# Patient Record
Sex: Female | Born: 1945 | Race: White | Hispanic: No | Smoking: Current every day smoker
Health system: Southern US, Community
[De-identification: ages and names within clinical notes are randomized; demographics above are authoritative.]

## PROBLEM LIST (undated history)

## (undated) DIAGNOSIS — I10 Essential (primary) hypertension: Secondary | ICD-10-CM

## (undated) DIAGNOSIS — E785 Hyperlipidemia, unspecified: Secondary | ICD-10-CM

## (undated) DIAGNOSIS — F329 Major depressive disorder, single episode, unspecified: Secondary | ICD-10-CM

## (undated) DIAGNOSIS — F32A Depression, unspecified: Secondary | ICD-10-CM

## (undated) DIAGNOSIS — F419 Anxiety disorder, unspecified: Secondary | ICD-10-CM

## (undated) HISTORY — DX: Major depressive disorder, single episode, unspecified: F32.9

## (undated) HISTORY — DX: Hyperlipidemia, unspecified: E78.5

## (undated) HISTORY — DX: Anxiety disorder, unspecified: F41.9

## (undated) HISTORY — PX: ABDOMINAL HYSTERECTOMY: SHX81

## (undated) HISTORY — DX: Depression, unspecified: F32.A

## (undated) HISTORY — DX: Essential (primary) hypertension: I10

## (undated) HISTORY — PX: TONSILLECTOMY: SUR1361

---

## 2005-01-02 ENCOUNTER — Ambulatory Visit: Payer: Self-pay | Admitting: Unknown Physician Specialty

## 2005-01-29 ENCOUNTER — Ambulatory Visit: Payer: Self-pay | Admitting: Unknown Physician Specialty

## 2005-01-29 LAB — HM COLONOSCOPY

## 2006-01-07 ENCOUNTER — Ambulatory Visit: Payer: Self-pay | Admitting: Family Medicine

## 2006-01-21 ENCOUNTER — Ambulatory Visit: Payer: Self-pay | Admitting: Family Medicine

## 2006-03-10 ENCOUNTER — Ambulatory Visit: Payer: Self-pay | Admitting: Urology

## 2006-04-20 DIAGNOSIS — E785 Hyperlipidemia, unspecified: Secondary | ICD-10-CM | POA: Insufficient documentation

## 2006-05-14 ENCOUNTER — Ambulatory Visit: Payer: Self-pay | Admitting: Anesthesiology

## 2006-05-28 ENCOUNTER — Ambulatory Visit: Payer: Self-pay | Admitting: Anesthesiology

## 2006-08-06 ENCOUNTER — Ambulatory Visit: Payer: Self-pay | Admitting: Anesthesiology

## 2006-08-12 ENCOUNTER — Emergency Department: Payer: Self-pay | Admitting: Emergency Medicine

## 2007-11-26 ENCOUNTER — Ambulatory Visit: Payer: Self-pay | Admitting: Unknown Physician Specialty

## 2007-11-29 ENCOUNTER — Ambulatory Visit: Payer: Self-pay | Admitting: Unknown Physician Specialty

## 2010-03-04 ENCOUNTER — Ambulatory Visit: Payer: Self-pay | Admitting: Neurological Surgery

## 2015-06-04 ENCOUNTER — Other Ambulatory Visit: Payer: Self-pay | Admitting: Family Medicine

## 2015-06-04 DIAGNOSIS — M549 Dorsalgia, unspecified: Principal | ICD-10-CM

## 2015-06-04 DIAGNOSIS — G8929 Other chronic pain: Secondary | ICD-10-CM

## 2015-09-26 ENCOUNTER — Other Ambulatory Visit: Payer: Self-pay | Admitting: Family Medicine

## 2015-09-26 DIAGNOSIS — E785 Hyperlipidemia, unspecified: Secondary | ICD-10-CM

## 2015-09-28 NOTE — Telephone Encounter (Signed)
Last OV 05/10/2014  Thanks,   -Vernona Rieger

## 2015-11-30 ENCOUNTER — Ambulatory Visit (INDEPENDENT_AMBULATORY_CARE_PROVIDER_SITE_OTHER): Payer: Medicare Other | Admitting: Family Medicine

## 2015-11-30 ENCOUNTER — Encounter: Payer: Self-pay | Admitting: Family Medicine

## 2015-11-30 VITALS — BP 142/84 | HR 76 | Temp 99.1°F | Resp 16 | Wt 144.0 lb

## 2015-11-30 DIAGNOSIS — J309 Allergic rhinitis, unspecified: Secondary | ICD-10-CM | POA: Insufficient documentation

## 2015-11-30 DIAGNOSIS — J209 Acute bronchitis, unspecified: Secondary | ICD-10-CM | POA: Insufficient documentation

## 2015-11-30 DIAGNOSIS — F419 Anxiety disorder, unspecified: Secondary | ICD-10-CM | POA: Diagnosis not present

## 2015-11-30 DIAGNOSIS — G8929 Other chronic pain: Secondary | ICD-10-CM

## 2015-11-30 DIAGNOSIS — Z720 Tobacco use: Secondary | ICD-10-CM

## 2015-11-30 DIAGNOSIS — G4700 Insomnia, unspecified: Secondary | ICD-10-CM | POA: Insufficient documentation

## 2015-11-30 DIAGNOSIS — M549 Dorsalgia, unspecified: Secondary | ICD-10-CM | POA: Diagnosis not present

## 2015-11-30 DIAGNOSIS — E785 Hyperlipidemia, unspecified: Secondary | ICD-10-CM

## 2015-11-30 DIAGNOSIS — I10 Essential (primary) hypertension: Secondary | ICD-10-CM | POA: Insufficient documentation

## 2015-11-30 DIAGNOSIS — M48061 Spinal stenosis, lumbar region without neurogenic claudication: Secondary | ICD-10-CM | POA: Insufficient documentation

## 2015-11-30 DIAGNOSIS — J302 Other seasonal allergic rhinitis: Secondary | ICD-10-CM

## 2015-11-30 DIAGNOSIS — M8580 Other specified disorders of bone density and structure, unspecified site: Secondary | ICD-10-CM | POA: Insufficient documentation

## 2015-11-30 DIAGNOSIS — Z8719 Personal history of other diseases of the digestive system: Secondary | ICD-10-CM | POA: Insufficient documentation

## 2015-11-30 DIAGNOSIS — M9979 Connective tissue and disc stenosis of intervertebral foramina of abdomen and other regions: Secondary | ICD-10-CM | POA: Insufficient documentation

## 2015-11-30 DIAGNOSIS — R002 Palpitations: Secondary | ICD-10-CM | POA: Insufficient documentation

## 2015-11-30 DIAGNOSIS — M419 Scoliosis, unspecified: Secondary | ICD-10-CM | POA: Insufficient documentation

## 2015-11-30 MED ORDER — MONTELUKAST SODIUM 10 MG PO TABS: 10.0000 mg | ORAL_TABLET | Freq: Every day | ORAL | Status: DC

## 2015-11-30 MED ORDER — GABAPENTIN 300 MG PO CAPS: 300.0000 mg | ORAL_CAPSULE | Freq: Three times a day (TID) | ORAL | Status: DC

## 2015-11-30 MED ORDER — ALPRAZOLAM 0.5 MG PO TABS: 0.5000 mg | ORAL_TABLET | Freq: Two times a day (BID) | ORAL | Status: DC | PRN

## 2015-11-30 NOTE — Progress Notes (Signed)
Patient ID: Melanie FoleySandra P Fredericks, female   DOB: [birth year], 69 y.o.   MRN: [id number]         Patient: Melanie FoleySandra P Wahlquist Female    DOB: [birth year]   69 y.o.   MRN: [id number] Visit Date: 11/30/2015  Today's Provider: Lorie PhenixNancy Rajohn Henery, MD   Chief Complaint  Patient presents with  . Hyperlipidemia  . Hypertension  . Back Pain   Subjective:    Hypertension This is a chronic problem. The current episode started more than 1 year ago. The problem is unchanged. The problem is controlled (Doesn't check her blood pressure at home; but reports it was okay at her last visit with [facility].). Associated symptoms include anxiety and malaise/fatigue. Pertinent negatives include no chest pain, headaches, neck pain, palpitations, peripheral edema or shortness of breath. There are no associated agents to hypertension. Risk factors for coronary artery disease include dyslipidemia. The current treatment provides moderate improvement. There are no compliance problems.   Back Pain This is a chronic problem. The problem has been gradually worsening (Especially in the last several months.  She would like to increase her Gabapentin to three time a day. She also took Vicodin in the past but it did not help much.  Fentanyl patches worked well but it caused bad side effects.   ) since onset. The pain is present in the lumbar spine. The pain does not radiate. The pain is moderate. The pain is the same all the time. The symptoms are aggravated by lying down and bending. Pertinent negatives include no bladder incontinence, bowel incontinence, chest pain or headaches. Treatments tried: Has taken herself off narcotics. Would lke to increase her gabapentin.     Anxiety Presents for follow-up visit. The problem has been gradually worsening. Symptoms include depressed mood, excessive worry, insomnia, irritability, nervous/anxious behavior and panic. Patient reports no chest pain, decreased concentration, dizziness, palpitations,  shortness of breath or suicidal ideas. Symptoms occur most days. The severity of symptoms is moderate. The symptoms are aggravated by family issues (Pt is having to take care of her sister who is having a lot of "Medical Problems"  She is having to do all the cleaning, shopping and she is not getting any help. Really hard on her back. ). The quality of sleep is poor.   Risk factors include family history (Mom and two aunts has anxiety. ). Compliance with medications: Does not really want a change in medication.  Thinks her mood is related to her pain.     Also having a lot of sinus drainage.   Has seen the ENT for this. Was previously using Claritin and nose spray.  Did stop the Claritin and it is worse.  Prednisone and antibiotic helped. Does have sinus X-rays pending. Has not gone yet. Frustrated with everything.     Allergies  Allergen Reactions  . Hydrochlorothiazide Diarrhea   Previous Medications   ALPRAZOLAM (XANAX) 0.5 MG TABLET    Take by mouth.   AMLODIPINE (NORVASC) 10 MG TABLET    Take by mouth.   CALCIUM CARBONATE 1250 MG CAPSULE    Take by mouth.   GABAPENTIN (NEURONTIN) 300 MG CAPSULE    TAKE 1 TO 2 CAPSULES EVERY DAY AS NEEDED   LISINOPRIL (PRINIVIL,ZESTRIL) 10 MG TABLET    Take by mouth.   MELOXICAM (MOBIC) 15 MG TABLET    Take by mouth.   METOPROLOL (LOPRESSOR) 50 MG TABLET    Take by mouth.   SIMVASTATIN (ZOCOR) 40 MG TABLET  TAKE 1 TABLET EVERY NIGHT AT BEDTIME   VITAMIN E 1000 UNIT CAPSULE    Take by mouth.    Review of Systems  Constitutional: Positive for malaise/fatigue and irritability.  HENT: Positive for congestion (Followed by [facility]. ).   Respiratory: Positive for cough, chest tightness and wheezing. Negative for apnea, choking, shortness of breath and stridor.   Cardiovascular: Negative for chest pain, palpitations and leg swelling.  Gastrointestinal: Negative.  Negative for bowel incontinence.  Genitourinary: Negative.  Negative for bladder  incontinence.  Musculoskeletal: Positive for back pain. Negative for joint swelling, arthralgias, gait problem, neck pain and neck stiffness.  Neurological: Negative for dizziness, light-headedness and headaches.  Psychiatric/Behavioral: Positive for agitation. Negative for suicidal ideas and decreased concentration. The patient is nervous/anxious and has insomnia.     Social History  Substance Use Topics  . Smoking status: Current Every Day Smoker  . Smokeless tobacco: Never Used  . Alcohol Use: No   Objective:   BP 142/84 mmHg  Pulse 76  Temp(Src) 99.1 F (37.3 C) (Oral)  Resp 16  Wt 144 lb (65.318 kg)  Physical Exam  Constitutional: She is oriented to person, place, and time. She appears well-developed and well-nourished.  Cardiovascular: Normal rate and regular rhythm.   Pulmonary/Chest: Effort normal and breath sounds normal.  Neurological: She is alert and oriented to person, place, and time.  Psychiatric: She has a normal mood and affect. Her behavior is normal. Judgment and thought content normal.      Assessment & Plan:     1. Essential hypertension Condition is stable. Please continue current medication and  plan of care as noted.  Will check labs.   - CBC with Differential/Platelet - Comprehensive Metabolic Panel (CMET)  2. HLD (hyperlipidemia) Needs labs checked.    - Lipid panel  3. Anxiety Continue current medication.  Will refill today.    - ALPRAZolam (XANAX) 0.5 MG tablet; Take 1 tablet (0.5 mg total) by mouth 2 (two) times daily as needed for anxiety.  Dispense: 60 tablet; Refill: 5 - TSH  4. Chronic back pain Unchanged.  Will increase gabapentin and see if helps. Does not want to go on narcotics.   - gabapentin (NEURONTIN) 300 MG capsule; Take 1 capsule (300 mg total) by mouth 3 (three) times daily.  Dispense: 270 capsule; Refill: 3   5. Other seasonal allergic rhinitis Still smokes and does not want to quit. Will add Singulair for congestion.      - montelukast (SINGULAIR) 10 MG tablet; Take 1 tablet (10 mg total) by mouth at bedtime.  Dispense: 90 tablet; Refill: 3  6. Tobacco abuse Still smokes. Does have "smoker's cough" and hoarseness. Not interested in quitting.       Lorie Phenix, MD  [facility]

## 2015-12-01 ENCOUNTER — Encounter: Payer: Self-pay | Admitting: Family Medicine

## 2015-12-01 DIAGNOSIS — Z720 Tobacco use: Secondary | ICD-10-CM | POA: Insufficient documentation

## 2015-12-01 LAB — COMPREHENSIVE METABOLIC PANEL
ALBUMIN: 4.1 g/dL (ref 3.5–4.8)
ALK PHOS: 90 IU/L (ref 39–117)
ALT: 12 IU/L (ref 0–32)
AST: 16 IU/L (ref 0–40)
Albumin/Globulin Ratio: 1.8 (ref 1.1–2.5)
BILIRUBIN TOTAL: 0.4 mg/dL (ref 0.0–1.2)
BUN / CREAT RATIO: 20 (ref 11–26)
BUN: 13 mg/dL (ref 8–27)
CHLORIDE: 100 mmol/L (ref 97–106)
CO2: 25 mmol/L (ref 18–29)
Calcium: 9.5 mg/dL (ref 8.7–10.3)
Creatinine, Ser: 0.65 mg/dL (ref 0.57–1.00)
GFR calc Af Amer: 104 mL/min/{1.73_m2} (ref >59)
GFR calc non Af Amer: 90 mL/min/{1.73_m2} (ref >59)
GLOBULIN, TOTAL: 2.3 g/dL (ref 1.5–4.5)
GLUCOSE: 92 mg/dL (ref 65–99)
POTASSIUM: 4.7 mmol/L (ref 3.5–5.2)
SODIUM: 141 mmol/L (ref 136–144)
Total Protein: 6.4 g/dL (ref 6.0–8.5)

## 2015-12-01 LAB — CBC WITH DIFFERENTIAL/PLATELET
BASOS: 0 %
Basophils Absolute: 0.1 10*3/uL (ref 0.0–0.2)
EOS (ABSOLUTE): 0.2 10*3/uL (ref 0.0–0.4)
Eos: 2 %
HEMOGLOBIN: 14.8 g/dL (ref 11.1–15.9)
Hematocrit: 43 % (ref 34.0–46.6)
IMMATURE GRANS (ABS): 0 10*3/uL (ref 0.0–0.1)
IMMATURE GRANULOCYTES: 0 %
LYMPHS: 19 %
Lymphocytes Absolute: 2.2 10*3/uL (ref 0.7–3.1)
MCH: 33.5 pg — ABNORMAL HIGH (ref 26.6–33.0)
MCHC: 34.4 g/dL (ref 31.5–35.7)
MCV: 97 fL (ref 79–97)
MONOCYTES: 7 %
Monocytes Absolute: 0.8 10*3/uL (ref 0.1–0.9)
NEUTROS ABS: 8.6 10*3/uL — AB (ref 1.4–7.0)
Neutrophils: 72 %
PLATELETS: 304 10*3/uL (ref 150–379)
RBC: 4.42 x10E6/uL (ref 3.77–5.28)
RDW: 13.2 % (ref 12.3–15.4)
WBC: 11.9 10*3/uL — AB (ref 3.4–10.8)

## 2015-12-01 LAB — LIPID PANEL
CHOLESTEROL TOTAL: 155 mg/dL (ref 100–199)
Chol/HDL Ratio: 2.9 ratio units (ref 0.0–4.4)
HDL: 53 mg/dL (ref >39)
LDL CALC: 68 mg/dL (ref 0–99)
Triglycerides: 171 mg/dL — ABNORMAL HIGH (ref 0–149)
VLDL CHOLESTEROL CAL: 34 mg/dL (ref 5–40)

## 2015-12-01 LAB — TSH: TSH: 1.39 u[IU]/mL (ref 0.450–4.500)

## 2015-12-04 ENCOUNTER — Telehealth: Payer: Self-pay

## 2015-12-04 NOTE — Telephone Encounter (Signed)
LMTCB 12/04/2015  Thanks,   -Kristl Morioka  

## 2015-12-04 NOTE — Telephone Encounter (Signed)
Pt advised as directed below.  She will call in early February for a lab sheet.   Thanks,   -Vernona RiegerLaura

## 2015-12-04 NOTE — Telephone Encounter (Signed)
-----   Message from Lorie PhenixNancy Maloney, MD sent at 12/01/2015 12:46 PM EST ----- Labs stable except for very mildly elevated white cell count.  Recheck in 6 weeks for stability. Thanks.

## 2015-12-11 ENCOUNTER — Other Ambulatory Visit: Payer: Self-pay | Admitting: Family Medicine

## 2015-12-11 DIAGNOSIS — I10 Essential (primary) hypertension: Secondary | ICD-10-CM

## 2016-01-11 DIAGNOSIS — J0191 Acute recurrent sinusitis, unspecified: Secondary | ICD-10-CM | POA: Diagnosis not present

## 2016-01-11 DIAGNOSIS — R490 Dysphonia: Secondary | ICD-10-CM | POA: Diagnosis not present

## 2016-01-11 DIAGNOSIS — J310 Chronic rhinitis: Secondary | ICD-10-CM | POA: Diagnosis not present

## 2016-01-17 ENCOUNTER — Other Ambulatory Visit: Payer: Self-pay

## 2016-01-17 DIAGNOSIS — E785 Hyperlipidemia, unspecified: Secondary | ICD-10-CM

## 2016-01-17 MED ORDER — SIMVASTATIN 40 MG PO TABS: 40.0000 mg | ORAL_TABLET | Freq: Every day | ORAL | Status: DC

## 2016-01-23 ENCOUNTER — Other Ambulatory Visit: Payer: Self-pay | Admitting: Family Medicine

## 2016-01-23 ENCOUNTER — Telehealth: Payer: Self-pay | Admitting: Family Medicine

## 2016-01-23 DIAGNOSIS — D72829 Elevated white blood cell count, unspecified: Secondary | ICD-10-CM | POA: Insufficient documentation

## 2016-01-23 NOTE — Telephone Encounter (Signed)
Repeat WBC. Thanks.

## 2016-01-24 LAB — CBC WITH DIFFERENTIAL/PLATELET
BASOS: 0 %
Basophils Absolute: 0 10*3/uL (ref 0.0–0.2)
EOS (ABSOLUTE): 0.2 10*3/uL (ref 0.0–0.4)
Eos: 2 %
Hematocrit: 42.6 % (ref 34.0–46.6)
Hemoglobin: 14.4 g/dL (ref 11.1–15.9)
IMMATURE GRANS (ABS): 0 10*3/uL (ref 0.0–0.1)
Immature Granulocytes: 0 %
LYMPHS ABS: 2.6 10*3/uL (ref 0.7–3.1)
LYMPHS: 21 %
MCH: 32.8 pg (ref 26.6–33.0)
MCHC: 33.8 g/dL (ref 31.5–35.7)
MCV: 97 fL (ref 79–97)
MONOS ABS: 1.1 10*3/uL — AB (ref 0.1–0.9)
Monocytes: 9 %
NEUTROS ABS: 8.2 10*3/uL — AB (ref 1.4–7.0)
Neutrophils: 68 %
PLATELETS: 274 10*3/uL (ref 150–379)
RBC: 4.39 x10E6/uL (ref 3.77–5.28)
RDW: 13.9 % (ref 12.3–15.4)
WBC: 12.2 10*3/uL — ABNORMAL HIGH (ref 3.4–10.8)

## 2016-01-25 ENCOUNTER — Telehealth: Payer: Self-pay

## 2016-01-25 NOTE — Telephone Encounter (Signed)
-----   Message from Lorie Phenix, MD sent at 01/24/2016 12:01 PM EST ----- White counts about the same, but character of cells has changed.  Doe not think is significant. Recheck in 3 months for stability.  Make sure patient with no fevers, weight loss or unusual symptoms.

## 2016-01-25 NOTE — Telephone Encounter (Signed)
Pt advised; she will call [facility] to set up an appointment.   Thanks,   -Vernona Rieger

## 2016-01-25 NOTE — Telephone Encounter (Signed)
In same office. Should not need another referral. Do not know what they do with internal transfer.  Thanks.

## 2016-01-25 NOTE — Telephone Encounter (Signed)
Pt c/o sinus sx. Has been seeing Dr. Willeen Cass, ENT. He put pt on Prednisone and Levaquin x 14 days (today was the last day taking the abx), Pt still c/o H/A, pressure between eyes and over eyebrows, rhinorrhea that is clear. Pt also taking Flonase, nasal saline irrigations, and Singulair qhs. ENT performed nasal endoscopy, and found "crust in back of sinuses". Pt is requesting if she could have a referral to Dr. Jenne Campus. Please advise. Allene Dillon, CMA

## 2016-03-18 DIAGNOSIS — R69 Illness, unspecified: Secondary | ICD-10-CM | POA: Diagnosis not present

## 2016-03-18 DIAGNOSIS — R51 Headache: Secondary | ICD-10-CM | POA: Diagnosis not present

## 2016-03-18 DIAGNOSIS — J309 Allergic rhinitis, unspecified: Secondary | ICD-10-CM | POA: Diagnosis not present

## 2016-04-15 ENCOUNTER — Other Ambulatory Visit: Payer: Self-pay

## 2016-04-15 DIAGNOSIS — D72829 Elevated white blood cell count, unspecified: Secondary | ICD-10-CM

## 2016-04-15 DIAGNOSIS — J305 Allergic rhinitis due to food: Secondary | ICD-10-CM | POA: Diagnosis not present

## 2016-04-15 DIAGNOSIS — J301 Allergic rhinitis due to pollen: Secondary | ICD-10-CM | POA: Diagnosis not present

## 2016-04-21 DIAGNOSIS — D72829 Elevated white blood cell count, unspecified: Secondary | ICD-10-CM | POA: Diagnosis not present

## 2016-04-22 ENCOUNTER — Telehealth: Payer: Self-pay

## 2016-04-22 LAB — CBC WITH DIFFERENTIAL/PLATELET
BASOS ABS: 0.1 10*3/uL (ref 0.0–0.2)
Basos: 1 %
EOS (ABSOLUTE): 0.2 10*3/uL (ref 0.0–0.4)
EOS: 2 %
HEMATOCRIT: 43.6 % (ref 34.0–46.6)
HEMOGLOBIN: 14.5 g/dL (ref 11.1–15.9)
IMMATURE GRANS (ABS): 0 10*3/uL (ref 0.0–0.1)
Immature Granulocytes: 0 %
LYMPHS ABS: 2.6 10*3/uL (ref 0.7–3.1)
LYMPHS: 31 %
MCH: 33.2 pg — ABNORMAL HIGH (ref 26.6–33.0)
MCHC: 33.3 g/dL (ref 31.5–35.7)
MCV: 100 fL — ABNORMAL HIGH (ref 79–97)
MONOCYTES: 9 %
Monocytes Absolute: 0.7 10*3/uL (ref 0.1–0.9)
Neutrophils Absolute: 4.8 10*3/uL (ref 1.4–7.0)
Neutrophils: 57 %
Platelets: 292 10*3/uL (ref 150–379)
RBC: 4.37 x10E6/uL (ref 3.77–5.28)
RDW: 13.8 % (ref 12.3–15.4)
WBC: 8.4 10*3/uL (ref 3.4–10.8)

## 2016-04-22 NOTE — Telephone Encounter (Signed)
-----   Message from Lorie PhenixNancy Maloney, MD sent at 04/22/2016 10:21 AM EDT ----- Labs stable.   Please notify patient. Thanks.

## 2016-04-22 NOTE — Telephone Encounter (Signed)
Pt advised.   Thanks,   -Laura  

## 2016-05-14 DIAGNOSIS — J323 Chronic sphenoidal sinusitis: Secondary | ICD-10-CM | POA: Diagnosis not present

## 2016-05-14 DIAGNOSIS — R0982 Postnasal drip: Secondary | ICD-10-CM | POA: Diagnosis not present

## 2016-05-20 ENCOUNTER — Other Ambulatory Visit: Payer: Self-pay | Admitting: Family Medicine

## 2016-05-20 DIAGNOSIS — I10 Essential (primary) hypertension: Secondary | ICD-10-CM

## 2016-07-14 ENCOUNTER — Telehealth: Payer: Self-pay

## 2016-07-14 DIAGNOSIS — E785 Hyperlipidemia, unspecified: Secondary | ICD-10-CM

## 2016-07-14 MED ORDER — SIMVASTATIN 40 MG PO TABS: 40.0000 mg | ORAL_TABLET | Freq: Every day | ORAL | 0 refills | Status: DC

## 2016-07-14 NOTE — Telephone Encounter (Signed)
Patient called and states that she needs Simvastatin refilled. She use to see Dr Elease Hashimoto. 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 I advised patient that its been over 6 months since she was here and that she would need to come in probably but will send a message back to check. Please review-aa

## 2016-07-14 NOTE — Telephone Encounter (Signed)
Pt advised and she will call back and make an appointment-aa

## 2016-07-14 NOTE — Telephone Encounter (Signed)
Will give 30 days but needs to schedule to have cholesterol rechecked.

## 2016-07-15 ENCOUNTER — Other Ambulatory Visit: Payer: Self-pay | Admitting: Family Medicine

## 2016-07-15 DIAGNOSIS — F419 Anxiety disorder, unspecified: Secondary | ICD-10-CM

## 2016-07-15 NOTE — Telephone Encounter (Signed)
Prescription for Alprazolam 0.5 was called to [facility].  Thanks,  -Avrianna Smart

## 2016-08-18 ENCOUNTER — Ambulatory Visit (INDEPENDENT_AMBULATORY_CARE_PROVIDER_SITE_OTHER): Payer: Medicare HMO | Admitting: Family Medicine

## 2016-08-18 ENCOUNTER — Encounter: Payer: Self-pay | Admitting: Family Medicine

## 2016-08-18 VITALS — BP 140/98 | HR 70 | Temp 98.5°F | Resp 16 | Wt 145.0 lb

## 2016-08-18 DIAGNOSIS — M48061 Spinal stenosis, lumbar region without neurogenic claudication: Secondary | ICD-10-CM

## 2016-08-18 DIAGNOSIS — G8929 Other chronic pain: Secondary | ICD-10-CM

## 2016-08-18 DIAGNOSIS — M549 Dorsalgia, unspecified: Secondary | ICD-10-CM

## 2016-08-18 DIAGNOSIS — E785 Hyperlipidemia, unspecified: Secondary | ICD-10-CM

## 2016-08-18 DIAGNOSIS — M4806 Spinal stenosis, lumbar region: Secondary | ICD-10-CM | POA: Diagnosis not present

## 2016-08-18 DIAGNOSIS — I10 Essential (primary) hypertension: Secondary | ICD-10-CM

## 2016-08-18 MED ORDER — GABAPENTIN 300 MG PO CAPS: ORAL_CAPSULE | ORAL | 1 refills | Status: DC

## 2016-08-18 MED ORDER — LISINOPRIL 10 MG PO TABS: 10.0000 mg | ORAL_TABLET | Freq: Every day | ORAL | 1 refills | Status: DC

## 2016-08-18 MED ORDER — SIMVASTATIN 40 MG PO TABS: 40.0000 mg | ORAL_TABLET | Freq: Every day | ORAL | 1 refills | Status: DC

## 2016-08-18 NOTE — Patient Instructions (Signed)
Let us know how you are doing on the increased dose of gabapentin.

## 2016-08-18 NOTE — Progress Notes (Signed)
Subjective:     Patient ID: Melanie White, female   DOB: 17-Dec-1946, 70 y.o.   MRN: [id number]  HPI  Chief Complaint  Patient presents with  . Hypertension    Follow up from 11/30/15, patients condition at last visit was stable and was advised to continue medication. Patients blood pressure at last visit was 142/84, patients reports poor compliance on medication.  . Hyperlipidemia    Follow up from 11/30/15, Lipid panel was ordered.   . Back Pain    Patient would like to address chronic back pain and discuss increasing Gabapentin  States her back tends to hurt more in the AM. Currently wears a TLO and has it on in the office today.Last labs 11/2015.   Review of Systems  Respiratory: Negative for shortness of breath.   Cardiovascular: Negative for chest pain and palpitations.       Objective:   Physical Exam  Constitutional: She appears well-developed and well-nourished. No distress.  Cardiovascular: Normal rate and regular rhythm.   Pulmonary/Chest: Breath sounds normal.  Musculoskeletal: She exhibits no edema (of lower extremities).       Assessment:    1. Lumbar canal stenosis  2. Chronic back pain - gabapentin (NEURONTIN) 300 MG capsule; Take two pills in the AM then one at midday then one in the evening  Dispense: 360 capsule; Refill: 1  3. Essential hypertension: continue metoprolol. - lisinopril (PRINIVIL,ZESTRIL) 10 MG tablet; Take 1 tablet (10 mg total) by mouth daily.  Dispense: 90 tablet; Refill: 1  4. HLD (hyperlipidemia) - simvastatin (ZOCOR) 40 MG tablet; Take 1 tablet (40 mg total) by mouth at bedtime.  Dispense: 90 tablet; Refill: 1    Plan:   Update labs at next office visit.

## 2016-11-15 ENCOUNTER — Other Ambulatory Visit: Payer: Self-pay | Admitting: Family Medicine

## 2016-11-15 DIAGNOSIS — I10 Essential (primary) hypertension: Secondary | ICD-10-CM

## 2016-11-17 ENCOUNTER — Other Ambulatory Visit: Payer: Self-pay | Admitting: Family Medicine

## 2016-11-17 DIAGNOSIS — I10 Essential (primary) hypertension: Secondary | ICD-10-CM

## 2016-11-17 MED ORDER — METOPROLOL TARTRATE 50 MG PO TABS: 50.0000 mg | ORAL_TABLET | Freq: Two times a day (BID) | ORAL | 1 refills | Status: DC

## 2017-01-16 ENCOUNTER — Other Ambulatory Visit: Payer: Self-pay | Admitting: Family Medicine

## 2017-01-16 DIAGNOSIS — F419 Anxiety disorder, unspecified: Secondary | ICD-10-CM

## 2017-01-16 NOTE — Telephone Encounter (Signed)
Please review-aa 

## 2017-01-16 NOTE — Telephone Encounter (Signed)
RX called in-aa 

## 2017-02-16 ENCOUNTER — Encounter: Payer: Self-pay | Admitting: Family Medicine

## 2017-02-16 ENCOUNTER — Ambulatory Visit (INDEPENDENT_AMBULATORY_CARE_PROVIDER_SITE_OTHER): Payer: Medicare HMO | Admitting: Family Medicine

## 2017-02-16 VITALS — BP 154/94 | HR 79 | Temp 99.4°F | Resp 16 | Wt 141.8 lb

## 2017-02-16 DIAGNOSIS — J302 Other seasonal allergic rhinitis: Secondary | ICD-10-CM

## 2017-02-16 DIAGNOSIS — M545 Low back pain: Secondary | ICD-10-CM | POA: Diagnosis not present

## 2017-02-16 DIAGNOSIS — I10 Essential (primary) hypertension: Secondary | ICD-10-CM | POA: Diagnosis not present

## 2017-02-16 DIAGNOSIS — R1013 Epigastric pain: Secondary | ICD-10-CM | POA: Diagnosis not present

## 2017-02-16 DIAGNOSIS — G8929 Other chronic pain: Secondary | ICD-10-CM | POA: Diagnosis not present

## 2017-02-16 DIAGNOSIS — E782 Mixed hyperlipidemia: Secondary | ICD-10-CM | POA: Diagnosis not present

## 2017-02-16 DIAGNOSIS — M48062 Spinal stenosis, lumbar region with neurogenic claudication: Secondary | ICD-10-CM | POA: Diagnosis not present

## 2017-02-16 MED ORDER — PANTOPRAZOLE SODIUM 40 MG PO TBEC: 40.0000 mg | DELAYED_RELEASE_TABLET | Freq: Every day | ORAL | 0 refills | Status: DC

## 2017-02-16 MED ORDER — SIMVASTATIN 40 MG PO TABS: 40.0000 mg | ORAL_TABLET | Freq: Every day | ORAL | 1 refills | Status: DC

## 2017-02-16 MED ORDER — GABAPENTIN 300 MG PO CAPS: ORAL_CAPSULE | ORAL | 1 refills | Status: DC

## 2017-02-16 MED ORDER — LISINOPRIL 20 MG PO TABS: 20.0000 mg | ORAL_TABLET | Freq: Every day | ORAL | 3 refills | Status: DC

## 2017-02-16 MED ORDER — MONTELUKAST SODIUM 10 MG PO TABS: 10.0000 mg | ORAL_TABLET | Freq: Every day | ORAL | 3 refills | Status: DC

## 2017-02-16 NOTE — Progress Notes (Signed)
Subjective:     Patient ID: Melanie White, female   DOB: Mar 13, 1946, 71 y.o.   MRN: [id number]  HPI  Chief Complaint  Patient presents with  . Hypertension    Patient comes in office today for 6 month follow up, last office visit was 08/18/16. At last visit patient was encouraged to continue Metoprolol and Lisinopril, patient blood pressure at visit was 140/98. Patient reports good compliance and tolerance on medication.  . Hyperlipidemia    Patient returns for follow up, last office visit 08/18/16. At last visit patient encouraged to continue Simvastatin 40mg , patient reports good complaince and tolerance on medication. Patient states that she is working on improving her eating habits but is not actively exercising.   . Back Pain    Patient returns for follow up last office visit was 08/18/16, Gabapendtin was increased to 30mg . Patient would like to discuss today changing medication, stating that it is causing her G.I upset.   States she has progression of her lumbar spinal stenosis with pain radiation down her thigh. Has been taking  6 ibuprofen daily, gabapentin, and using a back brace. Reports ESI in the past. Has developed stomach burning and occasional nausea whenever she eats. Reports hx of duodenal ulcer. Not currently on acid blocker prophylaxis.   Review of Systems  Gastrointestinal: Negative for blood in stool.       Objective:   Physical Exam  Constitutional: She appears well-developed and well-nourished. No distress (wearing a TLO).  Cardiovascular: Normal rate and regular rhythm.   Pulmonary/Chest: Breath sounds normal.  Abdominal: Soft. There is tenderness (mild epigastric). There is no guarding.  Musculoskeletal: She exhibits no edema (of lower extremties).       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 Mixed hyperlipidemia - simvastatin (ZOCOR) 40 MG tablet; Take 1 tablet (40 mg total) by mouth at bedtime.  Dispense: 90  tablet; Refill: 1 - Lipid panel  3. Essential hypertension: increase lisinopril to 20 mg. - Comprehensive metabolic panel  4. Chronic right-sided low back pain, with sciatica presence unspecified - gabapentin (NEURONTIN) 300 MG capsule; Take two pills in the AM then one at midday then one in the evening  Dispense: 360 capsule; Refill: 1  5. Other seasonal allergic rhinitis - montelukast (SINGULAIR) 10 MG tablet; Take 1 tablet (10 mg total) by mouth at bedtime.  Dispense: 90 tablet; Refill: 3  6. Epigastric abdominal pain - CBC with Differential/Platelet - Lipase - pantoprazole (PROTONIX) 40 MG tablet; Take 1 tablet (40 mg total) by mouth daily.  Dispense: 30 tablet; Refill: 0 - H. pylori breath test    Plan:    Further f/u in 4 weeks and pending lab work. She will consider referral back to neurosurgery.

## 2017-02-16 NOTE — Patient Instructions (Signed)
Hold ibuprofen and use Tylenol up to 3000 mg/day.

## 2017-02-17 LAB — CBC WITH DIFFERENTIAL/PLATELET
BASOS: 1 %
Basophils Absolute: 0.1 10*3/uL (ref 0.0–0.2)
EOS (ABSOLUTE): 0.2 10*3/uL (ref 0.0–0.4)
Eos: 3 %
HEMOGLOBIN: 14.7 g/dL (ref 11.1–15.9)
Hematocrit: 42.3 % (ref 34.0–46.6)
IMMATURE GRANS (ABS): 0 10*3/uL (ref 0.0–0.1)
IMMATURE GRANULOCYTES: 0 %
Lymphocytes Absolute: 2.8 10*3/uL (ref 0.7–3.1)
Lymphs: 34 %
MCH: 33 pg (ref 26.6–33.0)
MCHC: 34.8 g/dL (ref 31.5–35.7)
MCV: 95 fL (ref 79–97)
MONOCYTES: 8 %
Monocytes Absolute: 0.7 10*3/uL (ref 0.1–0.9)
NEUTROS ABS: 4.7 10*3/uL (ref 1.4–7.0)
NEUTROS PCT: 54 %
Platelets: 282 10*3/uL (ref 150–379)
RBC: 4.46 x10E6/uL (ref 3.77–5.28)
RDW: 13.7 % (ref 12.3–15.4)
WBC: 8.5 10*3/uL (ref 3.4–10.8)

## 2017-02-17 LAB — COMPREHENSIVE METABOLIC PANEL
ALT: 14 IU/L (ref 0–32)
AST: 16 IU/L (ref 0–40)
Albumin/Globulin Ratio: 1.7 (ref 1.2–2.2)
Albumin: 4.2 g/dL (ref 3.5–4.8)
Alkaline Phosphatase: 105 IU/L (ref 39–117)
BUN/Creatinine Ratio: 20 (ref 12–28)
BUN: 12 mg/dL (ref 8–27)
Bilirubin Total: 0.2 mg/dL (ref 0.0–1.2)
CALCIUM: 9.6 mg/dL (ref 8.7–10.3)
CO2: 27 mmol/L (ref 18–29)
CREATININE: 0.59 mg/dL (ref 0.57–1.00)
Chloride: 98 mmol/L (ref 96–106)
GFR, EST AFRICAN AMERICAN: 106 mL/min/{1.73_m2} (ref >59)
GFR, EST NON AFRICAN AMERICAN: 92 mL/min/{1.73_m2} (ref >59)
GLUCOSE: 90 mg/dL (ref 65–99)
Globulin, Total: 2.5 g/dL (ref 1.5–4.5)
Potassium: 4.9 mmol/L (ref 3.5–5.2)
Sodium: 140 mmol/L (ref 134–144)
TOTAL PROTEIN: 6.7 g/dL (ref 6.0–8.5)

## 2017-02-17 LAB — LIPASE: LIPASE: 53 U/L (ref 14–85)

## 2017-02-17 LAB — LIPID PANEL
Chol/HDL Ratio: 4 ratio units (ref 0.0–4.4)
Cholesterol, Total: 175 mg/dL (ref 100–199)
HDL: 44 mg/dL (ref >39)
LDL CALC: 85 mg/dL (ref 0–99)
Triglycerides: 231 mg/dL — ABNORMAL HIGH (ref 0–149)
VLDL CHOLESTEROL CAL: 46 mg/dL — AB (ref 5–40)

## 2017-02-18 ENCOUNTER — Telehealth: Payer: Self-pay

## 2017-02-18 LAB — H. PYLORI BREATH TEST

## 2017-02-18 LAB — H.PYLORI BREATH TEST (REFLEX): H. PYLORI BREATH TEST: NEGATIVE

## 2017-02-18 NOTE — Telephone Encounter (Signed)
-----   Message from Anola Gurneyobert Chauvin, [place] sent at 02/18/2017  7:53 AM EST ----- Your lab tests look good without anemia or sign of ulcer producing bacteria. Continue with Protonix and stay of ibuprofen or similar until we see you in 4 weeks.

## 2017-02-18 NOTE — Telephone Encounter (Signed)
[id number]-KW 

## 2017-02-18 NOTE — Telephone Encounter (Signed)
Patient has been advised. KW 

## 2017-03-16 ENCOUNTER — Ambulatory Visit: Payer: Medicare HMO | Admitting: Family Medicine

## 2017-03-30 ENCOUNTER — Encounter: Payer: Self-pay | Admitting: Family Medicine

## 2017-03-30 ENCOUNTER — Ambulatory Visit (INDEPENDENT_AMBULATORY_CARE_PROVIDER_SITE_OTHER): Payer: Medicare HMO | Admitting: Family Medicine

## 2017-03-30 VITALS — BP 144/92 | HR 71 | Temp 98.5°F | Resp 16 | Wt 140.0 lb

## 2017-03-30 DIAGNOSIS — I10 Essential (primary) hypertension: Secondary | ICD-10-CM | POA: Diagnosis not present

## 2017-03-30 DIAGNOSIS — Z8719 Personal history of other diseases of the digestive system: Secondary | ICD-10-CM

## 2017-03-30 DIAGNOSIS — M48062 Spinal stenosis, lumbar region with neurogenic claudication: Secondary | ICD-10-CM | POA: Diagnosis not present

## 2017-03-30 MED ORDER — RANITIDINE HCL 150 MG PO TABS: 150.0000 mg | ORAL_TABLET | Freq: Two times a day (BID) | ORAL | 3 refills | Status: DC

## 2017-03-30 MED ORDER — MELOXICAM 15 MG PO TABS: 15.0000 mg | ORAL_TABLET | Freq: Every day | ORAL | 3 refills | Status: DC

## 2017-03-30 MED ORDER — GABAPENTIN 300 MG PO CAPS: ORAL_CAPSULE | ORAL | 3 refills | Status: DC

## 2017-03-30 MED ORDER — LISINOPRIL 40 MG PO TABS: 40.0000 mg | ORAL_TABLET | Freq: Every day | ORAL | 3 refills | Status: DC

## 2017-03-30 NOTE — Patient Instructions (Signed)
We will see you in 4 weeks to check your blood pressure and see how your back medications are doing.

## 2017-03-30 NOTE — Progress Notes (Signed)
Subjective:     Patient ID: YSABELA KEISLER, female   DOB: 1946/09/14, 71 y.o.   MRN: [id number]  HPI  Chief Complaint  Patient presents with  . Hypertension    Patient returns to office today for 6 week follow up, last office visit was 02/16/17 we had increassed Lisinopril to . Patients blood pressure at last visit was 154/94, she reports good compliance and tolerance on medication.   . Back Pain    Patient returns to office for follow up of spinal setenosis of lumbar region. Patient reports that Ibuprofen and Gabapentin arent helping her with pain anymore.   States at times she will take an extra gabapentin around bedtime. States she has been told she is not a surgical candidate for her back pain. 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 Reports stomach pain from last visit resolved-she feels it was gastritis. No desire to quit smoking. Wearing a TLO today for her back. Recent labs were normal with excellent kidney function.   Review of Systems  Respiratory: Negative for shortness of breath.   Cardiovascular: Negative for chest pain and palpitations.       Objective:   Physical Exam  Constitutional: She appears well-developed and well-nourished. No distress.  Cardiovascular: Normal rate and regular rhythm.   Pulmonary/Chest: Breath sounds normal.  Musculoskeletal: She exhibits no edema (of lower extremities).       Assessment:    1. Essential hypertension - lisinopril (PRINIVIL,ZESTRIL) 40 MG tablet; Take 1 tablet (40 mg total) by mouth daily.  Dispense: 90 tablet; Refill: 3  2. Spinal stenosis of lumbar region with neurogenic claudication - gabapentin (NEURONTIN) 300 MG capsule; Take two pills three times a day  Dispense: 540 capsule; Refill: 3 - meloxicam (MOBIC) 15 MG tablet; Take 1 tablet (15 mg total) by mouth daily.  Dispense: 90 tablet; Refill: 3  3. Hx of gastritis - ranitidine (ZANTAC) 150 MG tablet; Take 1 tablet (150 mg total) by  mouth 2 (two) times daily.  Dispense: 180 tablet; Refill: 3    Plan:    Further f/u in 4 weeks.

## 2017-04-10 ENCOUNTER — Other Ambulatory Visit: Payer: Self-pay | Admitting: Family Medicine

## 2017-04-10 DIAGNOSIS — F419 Anxiety disorder, unspecified: Secondary | ICD-10-CM

## 2017-04-10 MED ORDER — ALPRAZOLAM 0.5 MG PO TABS: 0.5000 mg | ORAL_TABLET | Freq: Two times a day (BID) | ORAL | 5 refills | Status: DC | PRN

## 2017-04-10 NOTE — Progress Notes (Signed)
rx called in-aa 

## 2017-05-05 ENCOUNTER — Ambulatory Visit: Payer: Medicare HMO | Admitting: Family Medicine

## 2017-05-19 ENCOUNTER — Encounter: Payer: Self-pay | Admitting: Family Medicine

## 2017-05-19 ENCOUNTER — Ambulatory Visit (INDEPENDENT_AMBULATORY_CARE_PROVIDER_SITE_OTHER): Payer: Medicare HMO | Admitting: Family Medicine

## 2017-05-19 VITALS — BP 162/96 | HR 64 | Temp 97.7°F | Resp 16 | Wt 135.0 lb

## 2017-05-19 DIAGNOSIS — M48062 Spinal stenosis, lumbar region with neurogenic claudication: Secondary | ICD-10-CM

## 2017-05-19 DIAGNOSIS — I10 Essential (primary) hypertension: Secondary | ICD-10-CM

## 2017-05-19 DIAGNOSIS — Z8719 Personal history of other diseases of the digestive system: Secondary | ICD-10-CM | POA: Diagnosis not present

## 2017-05-19 MED ORDER — PANTOPRAZOLE SODIUM 40 MG PO TBEC: DELAYED_RELEASE_TABLET | ORAL | 0 refills | Status: DC

## 2017-05-19 MED ORDER — METOPROLOL TARTRATE 50 MG PO TABS: 50.0000 mg | ORAL_TABLET | Freq: Two times a day (BID) | ORAL | 1 refills | Status: DC

## 2017-05-19 MED ORDER — DULOXETINE HCL 30 MG PO CPEP: 30.0000 mg | ORAL_CAPSULE | Freq: Every day | ORAL | 0 refills | Status: DC

## 2017-05-19 NOTE — Patient Instructions (Signed)
Please come in for a bp check in two weeks and let us know how you are doing on the duloxetine (Cymbalta).

## 2017-05-19 NOTE — Progress Notes (Signed)
Subjective:     Patient ID: Melanie FoleySandra P Evangelist, female   DOB: [birth year], 71 y.o.   MRN: [id number]  HPI  Chief Complaint  Patient presents with  . Hypertension  States she is in a lot of pain today due to her spine/change in weather. Reports compliance with medication. Does state gabapentin was too expensive for 3 months and wishes to try a different medication. I suggested she also ask about non-insurance pricing and see it it is less. Also wishes refills on medications.   Review of Systems     Objective:   Physical Exam  Constitutional: She appears well-developed and well-nourished. No distress.  Cardiovascular: Normal rate and regular rhythm.   Pulmonary/Chest: Breath sounds normal. She has no wheezes.  Musculoskeletal: She exhibits no edema (of lower extremities).       Assessment:    1. Essential hypertension: ? Out of control due to pain and off gabapentin - metoprolol tartrate (LOPRESSOR) 50 MG tablet; Take 1 tablet (50 mg total) by mouth 2 (two) times daily.  Dispense: 180 tablet; Refill: 1  2. Spinal stenosis of lumbar region with neurogenic claudication - DULoxetine (CYMBALTA) 30 MG capsule; Take 1 capsule (30 mg total) by mouth daily.  Dispense: 30 capsule; Refill: 0  3. Hx of gastritis: continue Zantac - pantoprazole (PROTONIX) 40 MG tablet; One daily as needed for heartburn not relieved by Zantac  Dispense: 90 tablet; Refill: 0      Plan:    Nurse bp check in two weeks and check response to duloxetine.

## 2017-06-01 ENCOUNTER — Other Ambulatory Visit: Payer: Self-pay | Admitting: Family Medicine

## 2017-06-03 ENCOUNTER — Telehealth: Payer: Self-pay

## 2017-06-03 DIAGNOSIS — I10 Essential (primary) hypertension: Secondary | ICD-10-CM

## 2017-06-03 MED ORDER — AMLODIPINE BESYLATE 10 MG PO TABS: 10.0000 mg | ORAL_TABLET | Freq: Every day | ORAL | 0 refills | Status: DC

## 2017-06-03 NOTE — Telephone Encounter (Signed)
Pt comes in today for a Blood pressure check per Bob's request.  After reviewing Ms. Polka last office visit (05/19/2017)  her blood pressure was elevated at 162/96.  Pt thought her BP was elevated secondary to pain.  Nadine CountsBob then started her on Cymbalta 30mg  daily, and had her come in today for a Blood pressure check.    Ms. Rudy JewSutphen reports after taking one dose of Cymbalta she started to experience diarrhea, cold chills, nausea and no appetite.  She has not tried Cymbalta again.  She does state after a few days her symptoms completely resolved.    Today she comes in today for a Blood pressure check.  It is elevated at 176/100.  She denies any symptoms such as chest pain, heart palpitations, shortness of breath, headache, dizziness.  She is still having back pain however.    After discussing with Antony ContrasJenni she would like Ms. Schellinger to start on Amlodipine 10mg  a day and recheck with Nadine CountsBob in one week.  Also the symptoms described by the pt does not sound like common for Cymbalta and it could have been a virus.  Pt agreed to give Cymbalta another try and also agreed to start Amlodipine 10mg .  She scheduled a follow up visit with Nadine CountsBob for 06/09/2017.  Please send Amlodipine to [facility] [address]   Thanks,   -Vernona RiegerLaura

## 2017-06-03 NOTE — Telephone Encounter (Signed)
Amlodipine sent in to [facility] [address].  Nadine CountsBob just [place] you.

## 2017-06-04 ENCOUNTER — Other Ambulatory Visit: Payer: Self-pay | Admitting: Family Medicine

## 2017-06-09 ENCOUNTER — Ambulatory Visit (INDEPENDENT_AMBULATORY_CARE_PROVIDER_SITE_OTHER): Payer: Medicare HMO | Admitting: Family Medicine

## 2017-06-09 ENCOUNTER — Encounter: Payer: Self-pay | Admitting: Family Medicine

## 2017-06-09 VITALS — BP 168/90 | HR 70 | Temp 98.5°F | Resp 16 | Wt 134.8 lb

## 2017-06-09 DIAGNOSIS — I10 Essential (primary) hypertension: Secondary | ICD-10-CM

## 2017-06-09 NOTE — Patient Instructions (Signed)
Add furosemide 20 mg. In the AM and come in for a nurse bp check in 2 weeks. Stay off duloxetine.

## 2017-06-09 NOTE — Progress Notes (Signed)
Subjective:     Patient ID: Melanie FoleySandra P Sortor, female   DOB: [birth year], 71 y.o.   MRN: [id number]  HPI  Chief Complaint  Patient presents with  . Hypertension    Patient returns to office today for follow up visit, last office visit was 06/03/17. At patients last office visit blood pressure was  176/100, patient was continued on current bp meds. and advised to restart Cymbalta. Patiewnt reports that she has had good compliance and tolerance on Metoprolol, Amlodipine and Lisinopril but states that she discontinued Cymbalta again due to G.I upset.    Discussed adding a diuretic in the AM (states she has a supply of Lasix 20 mg.at home) and consider trying duloxetine at 20 mg.dose. She is going to check cost with pharmacist. May also reconsider paying for gabapentin and get back on that instead.    Review of Systems     Objective:   Physical Exam  Constitutional: She appears well-developed and well-nourished. No distress.  Cardiovascular: Normal rate and regular rhythm.   Pulmonary/Chest: No respiratory distress (coarse basilar breath sounds o/w clear.).  Musculoskeletal: She exhibits no edema (of lower extremities).       Assessment:    1. Essential hypertension: add furosemide as fourth bp medication    Plan:    Nurse bp check in two weeks.

## 2017-06-26 ENCOUNTER — Telehealth: Payer: Self-pay | Admitting: Emergency Medicine

## 2017-06-26 NOTE — Telephone Encounter (Signed)
Has she been taking furosemide (home supply) daily since I last saw her ?

## 2017-06-26 NOTE — Telephone Encounter (Signed)
LMTCB

## 2017-06-26 NOTE — Telephone Encounter (Signed)
Pt states that she has been taking furosemide once daily.

## 2017-06-26 NOTE — Telephone Encounter (Signed)
lmtcb-aa 

## 2017-06-26 NOTE — Telephone Encounter (Signed)
Have her make an office visit next week and we can discuss further medication changes and recheck her blood pressure. Continue furosemide daily.

## 2017-06-26 NOTE — Telephone Encounter (Signed)
Pt came by to have her BP checked. It was 166/82 today. Please advise if you want to adjust medications. Thanks.

## 2017-07-01 NOTE — Telephone Encounter (Signed)
appt has been scheduled for tomorrow 07/02/17. KW

## 2017-07-02 ENCOUNTER — Ambulatory Visit (INDEPENDENT_AMBULATORY_CARE_PROVIDER_SITE_OTHER): Payer: Medicare HMO | Admitting: Family Medicine

## 2017-07-02 ENCOUNTER — Encounter: Payer: Self-pay | Admitting: Family Medicine

## 2017-07-02 VITALS — BP 132/84 | HR 65 | Temp 98.2°F | Resp 16 | Wt 138.0 lb

## 2017-07-02 DIAGNOSIS — I10 Essential (primary) hypertension: Secondary | ICD-10-CM | POA: Diagnosis not present

## 2017-07-02 MED ORDER — AMLODIPINE BESYLATE 10 MG PO TABS: 10.0000 mg | ORAL_TABLET | Freq: Every day | ORAL | 3 refills | Status: DC

## 2017-07-02 NOTE — Progress Notes (Signed)
Subjective:     Patient ID: Melanie White, female   DOB: 25-Jul-1946, 71 y.o.   MRN: [id number]  HPI  Chief Complaint  Patient presents with  . Hypertension    Patient comes in office today for followup visit, at last office visit 6/19 Furosemide was added on to her list of medications. Patients blood pressure in house was 168/90, patient states she has not been checking blood pressure outside of office, she reports good compliance and tolerance on medication.   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 States her sister refuses most medical intervention but has been able to get her out to the neurologist and ophthalmologist. She occasionally sees one of the commercial house call doctors.Melanie White is aware that with her back problems she will be unable/unwilling to care for her if she is further debilitated. Discussed considering placement options. She also is going to try a new back brace and wishes me to approve it.   Review of Systems  Constitutional:       Defers mammograms       Objective:   Physical Exam  Constitutional: She appears well-developed and well-nourished. No distress.  Cardiovascular: Normal rate and regular rhythm.   Pulmonary/Chest: Breath sounds normal.  Musculoskeletal: She exhibits no edema (of lower extremities).       Assessment:    1. Essential hypertension: continues four bp medications. Consider adding spironolactone if needed with reduction in lisinopril. - amLODipine (NORVASC) 10 MG tablet; Take 1 tablet (10 mg total) by mouth daily.  Dispense: 90 tablet; Refill: 3    Plan:    Will let her try new brace after I review the application.

## 2017-08-17 ENCOUNTER — Other Ambulatory Visit: Payer: Self-pay | Admitting: Family Medicine

## 2017-08-17 DIAGNOSIS — E782 Mixed hyperlipidemia: Secondary | ICD-10-CM

## 2017-10-02 ENCOUNTER — Ambulatory Visit: Payer: Medicare HMO | Admitting: Family Medicine

## 2017-10-21 ENCOUNTER — Ambulatory Visit: Payer: Medicare HMO | Admitting: Family Medicine

## 2017-11-15 ENCOUNTER — Other Ambulatory Visit: Payer: Self-pay | Admitting: Family Medicine

## 2017-11-15 DIAGNOSIS — I10 Essential (primary) hypertension: Secondary | ICD-10-CM

## 2017-12-06 ENCOUNTER — Other Ambulatory Visit: Payer: Self-pay | Admitting: Family Medicine

## 2017-12-06 DIAGNOSIS — F419 Anxiety disorder, unspecified: Secondary | ICD-10-CM

## 2017-12-07 ENCOUNTER — Other Ambulatory Visit: Payer: Self-pay | Admitting: Family Medicine

## 2017-12-07 DIAGNOSIS — F419 Anxiety disorder, unspecified: Secondary | ICD-10-CM

## 2017-12-07 MED ORDER — ALPRAZOLAM 0.5 MG PO TABS: 0.5000 mg | ORAL_TABLET | Freq: Two times a day (BID) | ORAL | 5 refills | Status: DC | PRN

## 2018-01-25 ENCOUNTER — Telehealth: Payer: Self-pay

## 2018-01-25 ENCOUNTER — Other Ambulatory Visit: Payer: Self-pay | Admitting: Family Medicine

## 2018-01-25 NOTE — Telephone Encounter (Signed)
Patient states she is unable to get Gabapentin RX because it needs a PA. She states she is completely out of medication and needs this done ASAP. Pharmacy- [facility]. CB#[phone]

## 2018-01-25 NOTE — Telephone Encounter (Signed)
Contacted patient and told her to contact her pharmacy to have pre-authorization form faxed over to our office to start process.KW

## 2018-01-27 ENCOUNTER — Other Ambulatory Visit: Payer: Self-pay | Admitting: Family Medicine

## 2018-01-27 DIAGNOSIS — M48062 Spinal stenosis, lumbar region with neurogenic claudication: Secondary | ICD-10-CM

## 2018-01-27 MED ORDER — GABAPENTIN 300 MG PO CAPS: ORAL_CAPSULE | ORAL | 11 refills | Status: DC

## 2018-02-13 ENCOUNTER — Other Ambulatory Visit: Payer: Self-pay | Admitting: Family Medicine

## 2018-02-13 DIAGNOSIS — E782 Mixed hyperlipidemia: Secondary | ICD-10-CM

## 2018-05-14 ENCOUNTER — Encounter: Payer: Self-pay | Admitting: Family Medicine

## 2018-05-14 ENCOUNTER — Ambulatory Visit (INDEPENDENT_AMBULATORY_CARE_PROVIDER_SITE_OTHER): Payer: Medicare HMO | Admitting: Family Medicine

## 2018-05-14 VITALS — BP 124/80 | HR 71 | Temp 98.4°F | Resp 16 | Ht 66.0 in | Wt 133.0 lb

## 2018-05-14 DIAGNOSIS — J302 Other seasonal allergic rhinitis: Secondary | ICD-10-CM

## 2018-05-14 DIAGNOSIS — H6121 Impacted cerumen, right ear: Secondary | ICD-10-CM

## 2018-05-14 DIAGNOSIS — Z8719 Personal history of other diseases of the digestive system: Secondary | ICD-10-CM

## 2018-05-14 DIAGNOSIS — J301 Allergic rhinitis due to pollen: Secondary | ICD-10-CM | POA: Diagnosis not present

## 2018-05-14 DIAGNOSIS — E782 Mixed hyperlipidemia: Secondary | ICD-10-CM | POA: Diagnosis not present

## 2018-05-14 DIAGNOSIS — F419 Anxiety disorder, unspecified: Secondary | ICD-10-CM

## 2018-05-14 DIAGNOSIS — M48062 Spinal stenosis, lumbar region with neurogenic claudication: Secondary | ICD-10-CM

## 2018-05-14 DIAGNOSIS — I10 Essential (primary) hypertension: Secondary | ICD-10-CM

## 2018-05-14 DIAGNOSIS — R69 Illness, unspecified: Secondary | ICD-10-CM | POA: Diagnosis not present

## 2018-05-14 MED ORDER — MELOXICAM 15 MG PO TABS: 15.0000 mg | ORAL_TABLET | Freq: Every day | ORAL | 3 refills | Status: DC

## 2018-05-14 MED ORDER — AMLODIPINE BESYLATE 10 MG PO TABS: 10.0000 mg | ORAL_TABLET | Freq: Every day | ORAL | 3 refills | Status: DC

## 2018-05-14 MED ORDER — SIMVASTATIN 40 MG PO TABS: 40.0000 mg | ORAL_TABLET | Freq: Every day | ORAL | 3 refills | Status: DC

## 2018-05-14 MED ORDER — FUROSEMIDE 20 MG PO TABS: 20.0000 mg | ORAL_TABLET | Freq: Every day | ORAL | 3 refills | Status: DC

## 2018-05-14 MED ORDER — PANTOPRAZOLE SODIUM 40 MG PO TBEC: DELAYED_RELEASE_TABLET | ORAL | 0 refills | Status: DC

## 2018-05-14 MED ORDER — MONTELUKAST SODIUM 10 MG PO TABS: 10.0000 mg | ORAL_TABLET | Freq: Every day | ORAL | 3 refills | Status: DC

## 2018-05-14 MED ORDER — LISINOPRIL 40 MG PO TABS: 40.0000 mg | ORAL_TABLET | Freq: Every day | ORAL | 3 refills | Status: DC

## 2018-05-14 MED ORDER — RANITIDINE HCL 150 MG PO TABS: 150.0000 mg | ORAL_TABLET | Freq: Two times a day (BID) | ORAL | 3 refills | Status: DC

## 2018-05-14 MED ORDER — METOPROLOL TARTRATE 50 MG PO TABS: 50.0000 mg | ORAL_TABLET | Freq: Two times a day (BID) | ORAL | 3 refills | Status: DC

## 2018-05-14 NOTE — Progress Notes (Signed)
  Subjective:     Patient ID: Melanie White, female   DOB: 1946-03-11, 72 y.o.   MRN: [id number] Chief Complaint  Patient presents with  . Hypertension   HPI States she is exhausted taking care of er sister. States she fell again and is currently in [facility] but is not cooperating with therapy. I suggested to Melanie White this may be her opportunity to get her placed. She defers health maintenance; mammogram, bone density, pneumovax. Continues to smoke. Wishes her ears check for wax buildup.  Review of Systems  Respiratory: Negative for shortness of breath.   Cardiovascular: Negative for chest pain and palpitations.  Musculoskeletal:       Continues to wear a TLO when she is up.       Objective:   Physical Exam  Constitutional: She appears well-developed and well-nourished. No distress.  HENT:  L ear canal patent with intact TM without inflammation R ear canal obscured by cerumen. Procedure note: After irrigation by Melanie White ear canal is patent and TM intact  Cardiovascular: Normal rate and regular rhythm.  Pulmonary/Chest: Breath sounds normal.  Musculoskeletal: She exhibits no edema (of lower extremities).       Assessment:    1. Essential hypertension: continue current medications - Comprehensive metabolic panel - metoprolol tartrate (LOPRESSOR) 50 MG tablet; Take 1 tablet (50 mg total) by mouth 2 (two) times daily.  Dispense: 180 tablet; Refill: 3 - lisinopril (PRINIVIL,ZESTRIL) 40 MG tablet; Take 1 tablet (40 mg total) by mouth daily.  Dispense: 90 tablet; Refill: 3 - furosemide (LASIX) 20 MG tablet; Take 1 tablet (20 mg total) by mouth daily.  Dispense: 90 tablet; Refill: 3 - amLODipine (NORVASC) 10 MG tablet; Take 1 tablet (10 mg total) by mouth daily.  Dispense: 90 tablet; Refill: 3  2. Anxiety: continues with Xanax daily.  3. Cerumen debris on tympanic membrane of right ear: ear irrigation  4. Mixed hyperlipidemia - Lipid panel - simvastatin (ZOCOR) 40  MG tablet; Take 1 tablet (40 mg total) by mouth at bedtime.  Dispense: 90 tablet; Refill: 3  5. Hx of gastritis - ranitidine (ZANTAC) 150 MG tablet; Take 1 tablet (150 mg total) by mouth 2 (two) times daily.  Dispense: 180 tablet; Refill: 3 - pantoprazole (PROTONIX) 40 MG tablet; One daily as needed for heartburn not relieved by Zantac  Dispense: 90 tablet; Refill: 0  6. Other seasonal allergic rhinitis - montelukast (SINGULAIR) 10 MG tablet; Take 1 tablet (10 mg total) by mouth at bedtime.  Dispense: 90 tablet; Refill: 3  7. Spinal stenosis of lumbar region with neurogenic claudication: continue gabapentin - meloxicam (MOBIC) 15 MG tablet; Take 1 tablet (15 mg total) by mouth daily.  Dispense: 90 tablet; Refill: 3    Plan:    Further f/u pending lab work.

## 2018-05-14 NOTE — Patient Instructions (Signed)
We will call you with the lab results. Let me know when you need a refill on Xanax.

## 2018-06-04 ENCOUNTER — Encounter: Payer: Self-pay | Admitting: Family Medicine

## 2018-06-04 ENCOUNTER — Telehealth: Payer: Self-pay

## 2018-06-04 NOTE — Telephone Encounter (Signed)
Pt needs a note to be excuse from [employer].   Thanks,   -Vernona RiegerLaura

## 2018-06-04 NOTE — Telephone Encounter (Signed)
Please let her know a letter is up front for pickup.

## 2018-06-07 NOTE — Telephone Encounter (Signed)
Patient advised as below.  

## 2018-06-13 ENCOUNTER — Other Ambulatory Visit: Payer: Self-pay | Admitting: Family Medicine

## 2018-06-13 DIAGNOSIS — Z8719 Personal history of other diseases of the digestive system: Secondary | ICD-10-CM

## 2018-08-01 ENCOUNTER — Other Ambulatory Visit: Payer: Self-pay | Admitting: Family Medicine

## 2018-08-01 DIAGNOSIS — F419 Anxiety disorder, unspecified: Secondary | ICD-10-CM

## 2018-08-13 ENCOUNTER — Other Ambulatory Visit: Payer: Self-pay | Admitting: Family Medicine

## 2018-08-13 DIAGNOSIS — Z8719 Personal history of other diseases of the digestive system: Secondary | ICD-10-CM

## 2018-11-08 ENCOUNTER — Encounter: Payer: Self-pay | Admitting: Family Medicine

## 2018-11-08 ENCOUNTER — Ambulatory Visit (INDEPENDENT_AMBULATORY_CARE_PROVIDER_SITE_OTHER): Payer: Medicare HMO | Admitting: Family Medicine

## 2018-11-08 VITALS — BP 110/70 | HR 71 | Temp 98.2°F | Resp 16 | Wt 120.2 lb

## 2018-11-08 DIAGNOSIS — R69 Illness, unspecified: Secondary | ICD-10-CM | POA: Diagnosis not present

## 2018-11-08 DIAGNOSIS — K529 Noninfective gastroenteritis and colitis, unspecified: Secondary | ICD-10-CM

## 2018-11-08 DIAGNOSIS — I10 Essential (primary) hypertension: Secondary | ICD-10-CM

## 2018-11-08 DIAGNOSIS — F4321 Adjustment disorder with depressed mood: Secondary | ICD-10-CM

## 2018-11-08 MED ORDER — PAROXETINE HCL 10 MG PO TABS: ORAL_TABLET | ORAL | 0 refills | Status: DC

## 2018-11-08 MED ORDER — VALSARTAN 80 MG PO TABS: 80.0000 mg | ORAL_TABLET | Freq: Every day | ORAL | 0 refills | Status: DC

## 2018-11-08 NOTE — Progress Notes (Signed)
  Subjective:     Patient ID: Melanie White, female   DOB: 12/30/1945, 72 y.o.   MRN: [id number] Chief Complaint  Patient presents with  . Nausea    Patient comes in office today with complaints of G.I upset. Patient reports symptoms of nausea, chills, lose of appetite, vomiting, chills/sweats and loose stool. Patient has been taking otc Tylenol for relief.    HPI States vomiting, and frequent stools have abated. She continue to have mild nausea. Has kept up with her fluids and has started to eat again but has little appetite. Reports lisinopril upsets her stomach and wishes to change medication. States her sister died 8/14 and she admits to being depressed. Is concerned about living alone and asks about assisted living.  Review of Systems     Objective:   Physical Exam  Constitutional: She appears well-developed and well-nourished. No distress.  Has lost 13# since May  Abdominal: Soft. There is no tenderness. There is no guarding.       Assessment:    1. Adjustment disorder with depressed mood: start when eating again - PARoxetine (PAXIL) 10 MG tablet; Take 1/2 pill daily for 7 days then a whole pill daily  Dispense: 30 tablet; Refill: 0  2. Gastroenteritis: resolving  3. Essential hypertension - valsartan (DIOVAN) 80 MG tablet; Take 1 tablet (80 mg total) by mouth daily.  Dispense: 30 tablet; Refill: 0    Plan:    Further f/u in two weeks. She is to call assisted living facilities and see if she is a candidate.

## 2018-11-08 NOTE — Patient Instructions (Signed)
Try to resume eating along with keeping up with your fluids.Don't start the paroxetine until you are eating again. Stop lisinopril and try the valsartan.

## 2018-11-13 ENCOUNTER — Other Ambulatory Visit: Payer: Self-pay | Admitting: Family Medicine

## 2018-11-13 DIAGNOSIS — Z8719 Personal history of other diseases of the digestive system: Secondary | ICD-10-CM

## 2018-11-22 ENCOUNTER — Encounter: Payer: Self-pay | Admitting: Family Medicine

## 2018-11-22 ENCOUNTER — Other Ambulatory Visit: Payer: Self-pay

## 2018-11-22 ENCOUNTER — Ambulatory Visit (INDEPENDENT_AMBULATORY_CARE_PROVIDER_SITE_OTHER): Payer: Medicare HMO | Admitting: Family Medicine

## 2018-11-22 VITALS — BP 118/80 | HR 80 | Temp 98.2°F | Ht 65.75 in | Wt 119.6 lb

## 2018-11-22 DIAGNOSIS — F4321 Adjustment disorder with depressed mood: Secondary | ICD-10-CM

## 2018-11-22 DIAGNOSIS — N3091 Cystitis, unspecified with hematuria: Secondary | ICD-10-CM

## 2018-11-22 DIAGNOSIS — R300 Dysuria: Secondary | ICD-10-CM

## 2018-11-22 DIAGNOSIS — I10 Essential (primary) hypertension: Secondary | ICD-10-CM

## 2018-11-22 DIAGNOSIS — Z8719 Personal history of other diseases of the digestive system: Secondary | ICD-10-CM | POA: Diagnosis not present

## 2018-11-22 DIAGNOSIS — R69 Illness, unspecified: Secondary | ICD-10-CM | POA: Diagnosis not present

## 2018-11-22 LAB — POCT URINALYSIS DIPSTICK
Bilirubin, UA: NEGATIVE
GLUCOSE UA: NEGATIVE
Ketones, UA: NEGATIVE
Nitrite, UA: NEGATIVE
PROTEIN UA: POSITIVE — AB
SPEC GRAV UA: 1.015 (ref 1.010–1.025)
Urobilinogen, UA: 0.2 E.U./dL
pH, UA: 5 (ref 5.0–8.0)

## 2018-11-22 MED ORDER — FAMOTIDINE 40 MG PO TABS: 40.0000 mg | ORAL_TABLET | Freq: Two times a day (BID) | ORAL | 1 refills | Status: DC

## 2018-11-22 MED ORDER — CEPHALEXIN 500 MG PO CAPS: 500.0000 mg | ORAL_CAPSULE | Freq: Two times a day (BID) | ORAL | 0 refills | Status: DC

## 2018-11-22 NOTE — Patient Instructions (Addendum)
Start paroxetine at 1/2 pill daily. We will call about the social work referral and the urine culture results.

## 2018-11-22 NOTE — Progress Notes (Signed)
  Subjective:     Patient ID: Melanie FoleySandra P White, female   DOB: 06-13-1946, 72 y.o.   MRN: [id number] Chief Complaint  Patient presents with  . Depression    GI issues, HTN follow up 2 weeks  . burning with urination    started on 11/09/18   HPI States she has had urine burning for near two weeks associated with chills. Also wishes to switch from Zantac to an alternative medication. Reports mild dizziness with the switch to valsartan so will have her take it in the evening. She has not started the paroxetine but has started eating again and having formed bowels. She has expressed a desire to not live alone but has not called any assisted living facilities. Discussed social work referral.  Review of Systems     Objective:   Physical Exam  Constitutional: She appears well-developed and well-nourished. No distress.  Cardiovascular: Normal rate and regular rhythm.  Pulmonary/Chest: Breath sounds normal.  Musculoskeletal: She exhibits no edema.       Assessment:    1. Burning with urination - POCT Urinalysis Dipstick - Urine Culture  2. Essential hypertension: change valsartan to evening dosing  3. Adjustment disorder with depressed mood: start paroxetine - Ambulatory referral to Social Work  4. History of duodenal ulcer: switch to Pepcid  5. Cystitis with hematuria - cephALEXin (KEFLEX) 500 MG capsule; Take 1 capsule (500 mg total) by mouth 2 (two) times daily.  Dispense: 14 capsule; Refill: 0    Plan:    Further f/u pending urine culture results.

## 2018-11-24 ENCOUNTER — Telehealth: Payer: Self-pay

## 2018-11-24 LAB — URINE CULTURE

## 2018-11-24 NOTE — Telephone Encounter (Signed)
-----   Message from Anola Gurneyobert Chauvin, [place] sent at 11/24/2018  7:22 AM EST ----- No specific organism on urine culture. May stop antibiotic after 3 days. Is her urinary burning improved?

## 2018-11-24 NOTE — Telephone Encounter (Signed)
Patient advised.KW 

## 2018-11-24 NOTE — Telephone Encounter (Signed)
I would like her to try the abx for 3 days.

## 2018-11-24 NOTE — Telephone Encounter (Signed)
Patient was advised, she states that she just picked up antibiotic yesterday and is still having dysuria. KW

## 2018-12-20 ENCOUNTER — Encounter: Payer: Self-pay | Admitting: Family Medicine

## 2018-12-20 ENCOUNTER — Ambulatory Visit (INDEPENDENT_AMBULATORY_CARE_PROVIDER_SITE_OTHER): Payer: Medicare HMO | Admitting: Family Medicine

## 2018-12-20 ENCOUNTER — Other Ambulatory Visit: Payer: Self-pay

## 2018-12-20 VITALS — BP 142/70 | HR 84 | Temp 98.0°F | Ht 65.5 in | Wt 115.2 lb

## 2018-12-20 DIAGNOSIS — F4321 Adjustment disorder with depressed mood: Secondary | ICD-10-CM | POA: Diagnosis not present

## 2018-12-20 DIAGNOSIS — R69 Illness, unspecified: Secondary | ICD-10-CM | POA: Diagnosis not present

## 2018-12-20 NOTE — Progress Notes (Signed)
  Subjective:     Patient ID: Melanie FoleySandra P White, female   DOB: 1946/09/06, 72 y.o.   MRN: [id number] Chief Complaint  Patient presents with  . Follow-up    4 week on depression medication.  pt reports she can only take every once and a while because it bothers her stomach   HPI States her cystitis has resolved. She has tried 1/2 of the paroxetine sporadically but states it makes her stomach hurt. She is willing to try it again. Has lost a bit more weight (5# since November) but reports she is eating 3 meals a day with evening snacking. [facility] but the [facility] was prohibitive. Has phone numbers for facilities in [place] but has not called yet. I suggested she consider a roommate. Reports she has yet to clear out her deceased sister's belongings.  Review of Systems     Objective:   Physical Exam Constitutional:      General: She is not in acute distress. Neurological:     Mental Status: She is alert.  Psychiatric:        Mood and Affect: Mood normal.        Behavior: Behavior normal.        Assessment:    1. Adjustment disorder with depressed mood:will try Paroxetine 1/2 pill daily again     Plan:    Will call if not tolerating medication. Follow up for bp and cholesterol in February.

## 2018-12-20 NOTE — Patient Instructions (Signed)
Try the paroxetine again and let me know if you are not tolerating it.  Consider getting a roommate if you can't afford an independent living facility.

## 2019-02-15 ENCOUNTER — Ambulatory Visit (INDEPENDENT_AMBULATORY_CARE_PROVIDER_SITE_OTHER): Payer: Medicare HMO | Admitting: Family Medicine

## 2019-02-15 ENCOUNTER — Encounter: Payer: Self-pay | Admitting: Family Medicine

## 2019-02-15 VITALS — BP 130/70 | HR 79 | Temp 97.7°F | Resp 16 | Wt 114.0 lb

## 2019-02-15 DIAGNOSIS — Z8719 Personal history of other diseases of the digestive system: Secondary | ICD-10-CM | POA: Diagnosis not present

## 2019-02-15 DIAGNOSIS — I10 Essential (primary) hypertension: Secondary | ICD-10-CM | POA: Diagnosis not present

## 2019-02-15 DIAGNOSIS — R300 Dysuria: Secondary | ICD-10-CM

## 2019-02-15 DIAGNOSIS — N3090 Cystitis, unspecified without hematuria: Secondary | ICD-10-CM | POA: Diagnosis not present

## 2019-02-15 DIAGNOSIS — R69 Illness, unspecified: Secondary | ICD-10-CM | POA: Diagnosis not present

## 2019-02-15 DIAGNOSIS — F4321 Adjustment disorder with depressed mood: Secondary | ICD-10-CM | POA: Diagnosis not present

## 2019-02-15 LAB — POCT URINALYSIS DIPSTICK
Bilirubin, UA: NEGATIVE
Glucose, UA: NEGATIVE
Ketones, UA: NEGATIVE
NITRITE UA: NEGATIVE
PH UA: 5 (ref 5.0–8.0)
Protein, UA: POSITIVE — AB
Spec Grav, UA: 1.01 (ref 1.010–1.025)
Urobilinogen, UA: 0.2 E.U./dL

## 2019-02-15 MED ORDER — PANTOPRAZOLE SODIUM 40 MG PO TBEC: DELAYED_RELEASE_TABLET | ORAL | 1 refills | Status: DC

## 2019-02-15 MED ORDER — CEPHALEXIN 500 MG PO CAPS: 500.0000 mg | ORAL_CAPSULE | Freq: Two times a day (BID) | ORAL | 0 refills | Status: DC

## 2019-02-15 MED ORDER — DULOXETINE HCL 20 MG PO CPEP: 20.0000 mg | ORAL_CAPSULE | Freq: Every day | ORAL | 1 refills | Status: DC

## 2019-02-15 NOTE — Patient Instructions (Signed)
Try Pepcid (famotidine) daily for your stomach first. If no relief add Protonix occasionally. We will call you with the urine culture result. Try to reestablish with a new provider before you need further refills.

## 2019-02-15 NOTE — Progress Notes (Signed)
  Subjective:     Patient ID: Melanie White, female   DOB: 1946-02-24, 73 y.o.   MRN: [id number] Chief Complaint  Patient presents with  . Adjustment Disorder    Patient returns back to office today for follow up from 12/20/18. At last visit patient was started on Paxil 1/2 tablet daily. Patient reports poor compliance with medication stating that it made her mood worse when taking it, patient states that she takes drug every other day if needed. Patient reports that she was on medication to help cope after her sister passed away, patient states that she is under stress now from her grandchild with finances.   . Dysuria    Patient reports burning with urination intermittent for one week.    HPI Continues to grieve for her sister and has not cleared out her sister's belongings yet. Reports an adult grandchild is asking her for money. She quit gabapentin due to memory changes and stomach upset. Reports bloating at times with increased gas production.  Review of Systems     Objective:   Physical Exam Constitutional:      General: She is not in acute distress. Cardiovascular:     Rate and Rhythm: Normal rate and regular rhythm.  Pulmonary:     Breath sounds: Normal breath sounds.  Abdominal:     General: Bowel sounds are normal.     Tenderness: There is no abdominal tenderness. There is no guarding.  Musculoskeletal:     Right lower leg: No edema.     Left lower leg: No edema.  Neurological:     Mental Status: She is alert.        Assessment:    1. Dysuria - POCT urinalysis dipstick  2. Cystitis - Urine Culture - cephALEXin (KEFLEX) 500 MG capsule; Take 1 capsule (500 mg total) by mouth 2 (two) times daily.  Dispense: 14 capsule; Refill: 0  3. Hx of gastritis - pantoprazole (PROTONIX) 40 MG tablet; Take one daily as needed for hearburn not relieved by famotidine  Dispense: 90 tablet; Refill: 1  4. Adjustment disorder with depressed mood - DULoxetine (CYMBALTA) 20 MG  capsule; Take 1 capsule (20 mg total) by mouth daily.  Dispense: 30 capsule; Refill: 1  5. Essential hypertension: stable on current medication    Plan:    Try famotidine for daily use and Pantoprazole as needed along with simethicone. Further f/u pending urine culture result. Encouraged to establish with a new provider before she needs refills in May.

## 2019-02-18 ENCOUNTER — Telehealth: Payer: Self-pay

## 2019-02-18 LAB — URINE CULTURE

## 2019-02-18 NOTE — Telephone Encounter (Signed)
Patient advised she states that she just picked up antibiotic last night but has not taken it, patient states that some of her symptoms have gone away, do you still want patient to take medicine? KW

## 2019-02-18 NOTE — Telephone Encounter (Signed)
[id number] 

## 2019-02-18 NOTE — Telephone Encounter (Signed)
Patient advised.KW 

## 2019-02-18 NOTE — Telephone Encounter (Signed)
-----   Message from Anola Gurney, [place] sent at 02/18/2019  7:18 AM EST ----- No specific organism detected. Is she feeling better on the antibiotic.

## 2019-02-18 NOTE — Telephone Encounter (Signed)
Don't take antibiotic as culture did not grow anything.

## 2019-03-28 ENCOUNTER — Other Ambulatory Visit: Payer: Self-pay | Admitting: Physician Assistant

## 2019-03-28 DIAGNOSIS — F419 Anxiety disorder, unspecified: Secondary | ICD-10-CM

## 2019-03-28 NOTE — Telephone Encounter (Signed)
[facility] faxed refill request for the following medications:  ALPRAZolam (XANAX) 0.5 MG tablet  Pt was seeing Nadine Counts and isn't scheduled for a follow up with a new provider at this time. Please advise. Thanks TNP

## 2019-03-29 MED ORDER — ALPRAZOLAM 0.5 MG PO TABS: 0.5000 mg | ORAL_TABLET | Freq: Two times a day (BID) | ORAL | 0 refills | Status: DC | PRN

## 2019-03-29 NOTE — Telephone Encounter (Signed)
Patient advised as directed below.Patient reports that she will call back to get an appointment to establish care with a different provider.

## 2019-03-29 NOTE — Telephone Encounter (Signed)
I have given 30 tabs of xanax. She needs to establish with new provider. I do not fill this medication chronically and we will have to talk about alternatives if she is to establish with myself.

## 2019-05-02 ENCOUNTER — Telehealth: Payer: Self-pay

## 2019-05-03 ENCOUNTER — Telehealth: Payer: Self-pay

## 2019-05-03 ENCOUNTER — Encounter: Payer: Self-pay | Admitting: Physician Assistant

## 2019-05-03 NOTE — Telephone Encounter (Signed)
Patient called office requesting CMA give her a call back to discuss Alprazolam prescription. Patient states that she was told that she could not be continued long term on medication and needed to discuss alternatives. Patient states that this medication works well for her and would not like to change at this time, she request call back. KW

## 2019-05-03 NOTE — Telephone Encounter (Signed)
She was supposed to schedule an office visit. She needs to establish a new provider. Schedule her a visit please with whom she is going to establish.

## 2019-05-04 NOTE — Telephone Encounter (Signed)
Patient advised. Appointment scheduled for tomorrow 05/05/2019 at 3:20pm with Adriana.

## 2019-05-05 ENCOUNTER — Ambulatory Visit (INDEPENDENT_AMBULATORY_CARE_PROVIDER_SITE_OTHER): Payer: Medicare HMO | Admitting: Physician Assistant

## 2019-05-05 ENCOUNTER — Encounter: Payer: Self-pay | Admitting: Physician Assistant

## 2019-05-05 ENCOUNTER — Other Ambulatory Visit: Payer: Self-pay

## 2019-05-05 VITALS — BP 116/82 | Temp 99.1°F | Wt 115.6 lb

## 2019-05-05 DIAGNOSIS — Z720 Tobacco use: Secondary | ICD-10-CM

## 2019-05-05 DIAGNOSIS — I10 Essential (primary) hypertension: Secondary | ICD-10-CM

## 2019-05-05 DIAGNOSIS — E782 Mixed hyperlipidemia: Secondary | ICD-10-CM | POA: Diagnosis not present

## 2019-05-05 DIAGNOSIS — R300 Dysuria: Secondary | ICD-10-CM | POA: Diagnosis not present

## 2019-05-05 DIAGNOSIS — R69 Illness, unspecified: Secondary | ICD-10-CM | POA: Diagnosis not present

## 2019-05-05 DIAGNOSIS — F419 Anxiety disorder, unspecified: Secondary | ICD-10-CM

## 2019-05-05 DIAGNOSIS — R634 Abnormal weight loss: Secondary | ICD-10-CM

## 2019-05-05 DIAGNOSIS — G4700 Insomnia, unspecified: Secondary | ICD-10-CM

## 2019-05-05 LAB — POCT URINALYSIS DIPSTICK
Bilirubin, UA: NEGATIVE
Glucose, UA: NEGATIVE
Ketones, UA: NEGATIVE
Nitrite, UA: NEGATIVE
Protein, UA: NEGATIVE
Spec Grav, UA: 1.01 (ref 1.010–1.025)
Urobilinogen, UA: 0.2 E.U./dL
pH, UA: 6 (ref 5.0–8.0)

## 2019-05-05 MED ORDER — TRAZODONE HCL 50 MG PO TABS: 25.0000 mg | ORAL_TABLET | Freq: Every evening | ORAL | 0 refills | Status: DC | PRN

## 2019-05-05 MED ORDER — BUSPIRONE HCL 7.5 MG PO TABS: 7.5000 mg | ORAL_TABLET | Freq: Two times a day (BID) | ORAL | 0 refills | Status: DC

## 2019-05-05 MED ORDER — METOPROLOL TARTRATE 50 MG PO TABS: 50.0000 mg | ORAL_TABLET | Freq: Two times a day (BID) | ORAL | 1 refills | Status: DC

## 2019-05-05 NOTE — Progress Notes (Signed)
Patient: Melanie White Female    DOB: 1946-08-13   73 y.o.   MRN: [id number] Visit Date: 05/05/2019  Today's Provider: Trey Sailors, PA-C   Chief Complaint  Patient presents with  . Dysuria  . Anxiety   Subjective:   Patient following up today to establish care. Had previously seen Dr. Elease Hashimoto for many years. Then saw Toni Arthurs, PA-C who has since retired.  She is currently retired, used to work at [employer]. Her two children are deceased. She is divorced.   Dysuria   This is a recurrent problem. The current episode started in the past 7 days. The problem has been gradually worsening. The quality of the pain is described as burning. The pain is at a severity of 6/10. The pain is moderate. There has been no fever. Her past medical history is significant for recurrent UTIs.     Anxiety Patient presents today for anxiety follow-up. Patient was prescribed Xanax 0.5 MG BID as needed. Patient states that the medication is helping to control her anxiety.  She was previously prescribed cymbalta but reports she has decreased appetite with cymbalta and felt strange. Has tried Paxil twice and reports it didn't help, stopped it after one month. Reports she lived with her sister for 12 years and her sister died in hospice hospice last year. She relates a lot of her anxiety to this event. She reports she had been taking Xanax 0.5 mg at night for sleep. She has never tried anything else for sleep. She has never been to a Veterinary surgeon.   HTN: amlodipine 10 mg daily and metoprolol tartrate 50 mg BID  BP Readings from Last 3 Encounters:  05/05/19 116/82  02/15/19 130/70  12/20/18 (!) 142/70   HLD:   Lipid Panel     Component Value Date/Time   CHOL 175 02/16/2017 1604   TRIG 231 (H) 02/16/2017 1604   HDL 44 02/16/2017 1604   CHOLHDL 4.0 02/16/2017 1604   LDLCALC 85 02/16/2017 1604   Pedal Edema: Lasix 20 mg three times a week because she lies down a lot and her feet swell.    Tobacco Abuse: 1 pack a day since age 47. She does not want to quit.   Mammogram, colonoscopy, DEXA due.   Wt Readings from Last 3 Encounters:  05/05/19 115 lb 9.6 oz (52.4 kg)  02/15/19 114 lb (51.7 kg)  12/20/18 115 lb 3.2 oz (52.3 kg)     Allergies  Allergen Reactions  . Hydrochlorothiazide Diarrhea     Current Outpatient Medications:  .  ALPRAZolam (XANAX) 0.5 MG tablet, Take 1 tablet (0.5 mg total) by mouth 2 (two) times daily as needed. for anxiety, Disp: 30 tablet, Rfl: 0 .  amLODipine (NORVASC) 10 MG tablet, Take 1 tablet (10 mg total) by mouth daily., Disp: 90 tablet, Rfl: 3 .  furosemide (LASIX) 20 MG tablet, Take 1 tablet (20 mg total) by mouth daily., Disp: 90 tablet, Rfl: 3 .  meloxicam (MOBIC) 15 MG tablet, Take 1 tablet (15 mg total) by mouth daily., Disp: 90 tablet, Rfl: 3 .  metoprolol tartrate (LOPRESSOR) 50 MG tablet, Take 1 tablet (50 mg total) by mouth 2 (two) times daily., Disp: 180 tablet, Rfl: 3 .  montelukast (SINGULAIR) 10 MG tablet, Take 1 tablet (10 mg total) by mouth at bedtime., Disp: 90 tablet, Rfl: 3 .  pantoprazole (PROTONIX) 40 MG tablet, Take one daily as needed for hearburn not relieved by pantoprazole., Disp:  90 tablet, Rfl: 1 .  simvastatin (ZOCOR) 40 MG tablet, Take 1 tablet (40 mg total) by mouth at bedtime., Disp: 90 tablet, Rfl: 3 .  cephALEXin (KEFLEX) 500 MG capsule, Take 1 capsule (500 mg total) by mouth 2 (two) times daily. (Patient not taking: Reported on 05/05/2019), Disp: 14 capsule, Rfl: 0 .  DULoxetine (CYMBALTA) 20 MG capsule, Take 1 capsule (20 mg total) by mouth daily. (Patient not taking: Reported on 05/05/2019), Disp: 30 capsule, Rfl: 1  Review of Systems  Respiratory: Negative.   Gastrointestinal: Negative.   Genitourinary: Positive for dysuria.  Musculoskeletal: Negative.   Neurological: Negative.   Hematological: Negative.   Psychiatric/Behavioral: Negative.     Social History   Tobacco Use  . Smoking status:  Current Every Day Smoker    Types: Cigarettes  . Smokeless tobacco: Never Used  Substance Use Topics  . Alcohol use: No      Objective:   BP 116/82 (BP Location: Left Arm, Patient Position: Sitting, Cuff Size: Normal)   Temp 99.1 F (37.3 C) (Oral)   Wt 115 lb 9.6 oz (52.4 kg)   BMI 18.94 kg/m  Vitals:   05/05/19 1526  BP: 116/82  Temp: 99.1 F (37.3 C)  TempSrc: Oral  Weight: 115 lb 9.6 oz (52.4 kg)     Physical Exam Constitutional:      Appearance: Normal appearance.  Cardiovascular:     Rate and Rhythm: Normal rate and regular rhythm.     Heart sounds: Normal heart sounds.  Pulmonary:     Effort: Pulmonary effort is normal.     Breath sounds: Normal breath sounds.  Skin:    General: Skin is warm and dry.  Neurological:     Mental Status: She is alert.  Psychiatric:        Mood and Affect: Mood is anxious and depressed.        Behavior: Behavior normal.         Assessment & Plan    1. Anxiety  Start as below. She has been without Xanax for four days. Have counseled that people can have rebound insomnia and anxiety when coming off Xanax. Explained the risks of Xanax including oversedation, dementia, falls, hip fractures, and car crashes. Have counseled that I do not think this is ideal medication for her to be on continuously and we will try buspar and trazodone. Have offered counseling sessions with our CSW but she declines, reports she does not think it would be helpful.   - busPIRone (BUSPAR) 7.5 MG tablet; Take 1 tablet (7.5 mg total) by mouth 2 (two) times daily.  Dispense: 180 tablet; Refill: 0  2. Essential hypertension  She needs to have a physical with labs, neither of which she wants to do right now.   - metoprolol tartrate (LOPRESSOR) 50 MG tablet; Take 1 tablet (50 mg total) by mouth 2 (two) times daily.  Dispense: 180 tablet; Refill: 1  3. Mixed hyperlipidemia   4. Tobacco abuse   5. Weight loss  Concerned for malignant process, she has  lost 30 pounds over two years without trying. She has a longstanding history of tobacco abuse. She has never been screened for lung cancer. She does not want to pursue this right now and she says maybe she will consider it next time.  6. Insomnia, unspecified type  - traZODone (DESYREL) 50 MG tablet; Take 0.5-1 tablets (25-50 mg total) by mouth at bedtime as needed for sleep.  Dispense: 30 tablet; Refill: 0  7. Dysuria  - POCT Urinalysis Dipstick - CULTURE, URINE COMPREHENSIVE  The entirety of the information documented in the History of Present Illness, Review of Systems and Physical Exam were personally obtained by me. Portions of this information were initially documented by [facility], CMA and reviewed by me for thoroughness and accuracy.          Trey Sailors, PA-C  [facility]

## 2019-05-05 NOTE — Patient Instructions (Signed)

## 2019-05-06 DIAGNOSIS — R300 Dysuria: Secondary | ICD-10-CM | POA: Diagnosis not present

## 2019-05-09 ENCOUNTER — Telehealth: Payer: Self-pay

## 2019-05-09 DIAGNOSIS — R300 Dysuria: Secondary | ICD-10-CM

## 2019-05-09 MED ORDER — CEPHALEXIN 500 MG PO CAPS: 500.0000 mg | ORAL_CAPSULE | Freq: Two times a day (BID) | ORAL | 0 refills | Status: AC

## 2019-05-09 NOTE — Telephone Encounter (Signed)
Keflex 500 mg BID x 5 days sent into pharmacy.

## 2019-05-09 NOTE — Telephone Encounter (Signed)
Patient notified of results. She states that she is still having symptoms of burning and slight irritation. Can you please send her an antibiotic?

## 2019-05-09 NOTE — Telephone Encounter (Signed)
-----   Message from Trey Sailors, [place] sent at 05/09/2019  1:56 PM EDT ----- Urine culture grew out bacteria but not in a high enough number to typically cause an infection. Is she still having symptoms? If so, will send in antibiotic.

## 2019-05-11 LAB — SPECIMEN STATUS REPORT

## 2019-05-11 LAB — CULTURE, URINE COMPREHENSIVE

## 2019-05-12 ENCOUNTER — Telehealth: Payer: Self-pay

## 2019-05-12 DIAGNOSIS — N3090 Cystitis, unspecified without hematuria: Secondary | ICD-10-CM

## 2019-05-12 MED ORDER — CIPROFLOXACIN HCL 250 MG PO TABS: 250.0000 mg | ORAL_TABLET | Freq: Two times a day (BID) | ORAL | 0 refills | Status: DC

## 2019-05-12 NOTE — Telephone Encounter (Signed)
-----   Message from Trey Sailors, [place] sent at 05/11/2019 11:41 AM EDT ----- The antibiotic I gave doesn't work against the bacteria. If she is still having symptoms I would need to send another one. If she is not, it is fine.

## 2019-05-12 NOTE — Telephone Encounter (Signed)
Patient was advised she is still having symptoms and would [facility] [address]. KW

## 2019-05-12 NOTE — Telephone Encounter (Signed)
Advised patient as below.  

## 2019-05-12 NOTE — Telephone Encounter (Signed)
Sent in cipro. Stop keflex. If she has symptoms after this she will need re-evaluation.

## 2019-05-19 ENCOUNTER — Other Ambulatory Visit: Payer: Self-pay | Admitting: Physician Assistant

## 2019-05-19 ENCOUNTER — Telehealth: Payer: Self-pay

## 2019-05-19 DIAGNOSIS — G4700 Insomnia, unspecified: Secondary | ICD-10-CM

## 2019-05-19 DIAGNOSIS — E782 Mixed hyperlipidemia: Secondary | ICD-10-CM

## 2019-05-19 MED ORDER — DOXEPIN HCL 3 MG PO TABS: 3.0000 mg | ORAL_TABLET | Freq: Every day | ORAL | 2 refills | Status: DC

## 2019-05-19 MED ORDER — SIMVASTATIN 40 MG PO TABS: 40.0000 mg | ORAL_TABLET | Freq: Every day | ORAL | 3 refills | Status: DC

## 2019-05-19 NOTE — Telephone Encounter (Signed)
[facility] faxed refill request for the following medications:  simvastatin (ZOCOR) 40 MG tablet  Please advise. Thanks TNP

## 2019-05-19 NOTE — Telephone Encounter (Signed)
Patient called and stated that she was prescribed Buspar & Trazodone. She states that she has been taking Trazodone 25mg  daily and it has been upsetting her stomach and she can not sleep. Patient states she went up to Trazodone 50 mg on 05/18/2019 and it has gave her diarrhea today. Patient states she would like to go back on her Xanax that she has been taking for years. Please advise.

## 2019-05-19 NOTE — Telephone Encounter (Signed)
We have had a long, in depth discussion about how xanax is not appropriate in the long term for sleep and the multiple risks related to this. When people come off Xanax, they will have increased insomnia. I have explained to patient that I am not willing to prescribe this medication. I will send her doxepin 3 mg nightly for insomnia. Will see her at follow up.

## 2019-05-20 ENCOUNTER — Telehealth: Payer: Self-pay

## 2019-05-20 NOTE — Telephone Encounter (Signed)
Patient called stating that she requested a prescription for Xanax. I explained that Doxepin was sent to the pharmacy, and medication concerns or changes will be discussed at next office visit.

## 2019-06-02 ENCOUNTER — Encounter: Payer: Self-pay | Admitting: Physician Assistant

## 2019-06-02 ENCOUNTER — Ambulatory Visit (INDEPENDENT_AMBULATORY_CARE_PROVIDER_SITE_OTHER): Payer: Medicare HMO | Admitting: Physician Assistant

## 2019-06-02 ENCOUNTER — Other Ambulatory Visit: Payer: Self-pay

## 2019-06-02 VITALS — BP 116/61 | HR 77 | Temp 98.2°F | Resp 16 | Wt 113.0 lb

## 2019-06-02 DIAGNOSIS — I10 Essential (primary) hypertension: Secondary | ICD-10-CM

## 2019-06-02 DIAGNOSIS — E782 Mixed hyperlipidemia: Secondary | ICD-10-CM | POA: Diagnosis not present

## 2019-06-02 DIAGNOSIS — F419 Anxiety disorder, unspecified: Secondary | ICD-10-CM | POA: Diagnosis not present

## 2019-06-02 DIAGNOSIS — G4700 Insomnia, unspecified: Secondary | ICD-10-CM

## 2019-06-02 DIAGNOSIS — R69 Illness, unspecified: Secondary | ICD-10-CM | POA: Diagnosis not present

## 2019-06-02 MED ORDER — BUSPIRONE HCL 15 MG PO TABS: 15.0000 mg | ORAL_TABLET | Freq: Two times a day (BID) | ORAL | 0 refills | Status: AC

## 2019-06-02 MED ORDER — BELSOMRA 10 MG PO TABS: 10.0000 mg | ORAL_TABLET | Freq: Every evening | ORAL | 1 refills | Status: DC

## 2019-06-02 NOTE — Patient Instructions (Signed)
Take two tablets of buspar 7.5 mg in the morning and one at night for a week. Then take two tablets of the 7.5 mg buspar twice daily.

## 2019-06-02 NOTE — Progress Notes (Signed)
Patient: Melanie White Female    DOB: [birth year]   73 y.o.   MRN: [id number] Visit Date: 06/02/2019  Today's Provider: Trinna Post, PA-C   Chief Complaint  Patient presents with  . Follow-up   Subjective:     HPI  Follow up for anxiety and insomnia  The patient was last seen for this 4 weeks ago. Changes made at last visit include start buspirone and trazodone.  She reports excellent compliance with treatment. She feels that condition is Unchanged. She is having side effects. Abdominal pain Patient reports she is having panic attacks and not being able to sleep. She had been on Xanax 0.5 mg nightly for years for sleep.   She reports she takes trazadone 25 mg nightly at first and then takes another 25 mg 1/2 later if she feels it is not working. Reports diarrhea with this. Patient called back to clinic and trazadone was changed to doxepin. However, patient did not pick this up because it was cost prohibitive.   Patient reports she drinks two bottles of mountain dew daily. She reports she will be drinking mountain dew up until the time when she goes to sleep. She also reports that she will watch TV at night when she cannot sleep. She does not drink alcohol.  Patient reports she is taking buspar 7.5 mg BID and her anxiety is uncontrolled. She feels her anxiety is worse. Feels the medications do not relax her like Xanax did.   Refuses labwork for chronic conditions today.  ------------------------------------------------------------------------------------   Allergies  Allergen Reactions  . Hydrochlorothiazide Diarrhea     Current Outpatient Medications:  .  amLODipine (NORVASC) 10 MG tablet, Take 1 tablet (10 mg total) by mouth daily., Disp: 90 tablet, Rfl: 3 .  busPIRone (BUSPAR) 7.5 MG tablet, Take 1 tablet (7.5 mg total) by mouth 2 (two) times daily., Disp: 180 tablet, Rfl: 0 .  Doxepin HCl 3 MG TABS, Take 1 tablet (3 mg total) by mouth at bedtime., Disp:  30 tablet, Rfl: 2 .  furosemide (LASIX) 20 MG tablet, Take 1 tablet (20 mg total) by mouth daily., Disp: 90 tablet, Rfl: 3 .  meloxicam (MOBIC) 15 MG tablet, Take 1 tablet (15 mg total) by mouth daily., Disp: 90 tablet, Rfl: 3 .  metoprolol tartrate (LOPRESSOR) 50 MG tablet, Take 1 tablet (50 mg total) by mouth 2 (two) times daily., Disp: 180 tablet, Rfl: 1 .  montelukast (SINGULAIR) 10 MG tablet, Take 1 tablet (10 mg total) by mouth at bedtime., Disp: 90 tablet, Rfl: 3 .  pantoprazole (PROTONIX) 40 MG tablet, Take one daily as needed for hearburn not relieved by pantoprazole., Disp: 90 tablet, Rfl: 1 .  simvastatin (ZOCOR) 40 MG tablet, Take 1 tablet (40 mg total) by mouth at bedtime., Disp: 90 tablet, Rfl: 3 .  traZODone (DESYREL) 50 MG tablet, Take 0.5-1 tablets (25-50 mg total) by mouth at bedtime as needed for sleep., Disp: 30 tablet, Rfl: 0  Review of Systems  Constitutional: Negative.   Cardiovascular: Negative.   Psychiatric/Behavioral: Positive for agitation and sleep disturbance. The patient is nervous/anxious.     Social History   Tobacco Use  . Smoking status: Current Every Day Smoker    Types: Cigarettes  . Smokeless tobacco: Never Used  Substance Use Topics  . Alcohol use: No      Objective:   BP 116/61 (BP Location: Left Arm, Patient Position: Sitting, Cuff Size: Normal)   Pulse 77  Temp 98.2 F (36.8 C) (Oral)   Resp 16   Wt 113 lb (51.3 kg)   BMI 18.52 kg/m  Vitals:   06/02/19 1442  BP: 116/61  Pulse: 77  Resp: 16  Temp: 98.2 F (36.8 C)  TempSrc: Oral  Weight: 113 lb (51.3 kg)     Physical Exam Constitutional:      Appearance: Normal appearance.  Cardiovascular:     Rate and Rhythm: Normal rate.  Pulmonary:     Effort: Pulmonary effort is normal. No respiratory distress.  Skin:    General: Skin is warm and dry.  Neurological:     Mental Status: She is alert and oriented to person, place, and time. Mental status is at baseline.  Psychiatric:         Attention and Perception: Attention normal.        Mood and Affect: Mood is depressed.     Comments: Irritable in office today.          Assessment & Plan    1. Insomnia, unspecified type  Explained to patient that tapering off of benzodiazepines can lead to rebound insomnia which can last several months. Counseled that caffeine also interferes with sleep, particularly the two bottles of mountain dew and she should make every effort to eliminate these starting with the one closest to bed time. Additionally, she should avoid TV when she cannot sleep. Will try medication as below.  - Suvorexant (BELSOMRA) 10 MG TABS; Take 10 mg by mouth Nightly.  Dispense: 30 tablet; Refill: 1  2. Essential hypertension  Controlled, patient refuses labwork. Patient has not had labwork in 2 years.   3. Anxiety  Will increase buspar as below.   - busPIRone (BUSPAR) 15 MG tablet; Take 1 tablet (15 mg total) by mouth 2 (two) times daily.  Dispense: 180 tablet; Refill: 0  4. Mixed hyperlipidemia  No labwork for two years, patient refuses labwork today.  The entirety of the information documented in the History of Present Illness, Review of Systems and Physical Exam were personally obtained by me. Portions of this information were initially documented by Rondel BatonSulibeya Dimas, CMA and reviewed by me for thoroughness and accuracy.   F/u 1 month for insomnia      Trey SailorsAdriana M Audrielle Vankuren, PA-C  [facility]

## 2019-06-03 ENCOUNTER — Telehealth: Payer: Self-pay

## 2019-06-03 DIAGNOSIS — G4700 Insomnia, unspecified: Secondary | ICD-10-CM

## 2019-06-03 NOTE — Telephone Encounter (Signed)
Patient called stating that the Belsomra is $329. Patient wants to know if something else can be prescribed that is cheaper. Please advise. [facility]acy: [facility] [address]

## 2019-06-03 NOTE — Telephone Encounter (Signed)
Patient was advised.  

## 2019-06-03 NOTE — Telephone Encounter (Signed)
Let me put in a referral to our CCM team pharmacist to see if we can help her with drug coverage.

## 2019-06-06 ENCOUNTER — Ambulatory Visit: Payer: Self-pay | Admitting: Pharmacist

## 2019-06-06 NOTE — Chronic Care Management (AMB) (Signed)
  Chronic Care Management   Note  06/06/2019 Name: Melanie White MRN: [id number] DOB: 05-25-1946  73 y.o. year old female referred to Chronic Care Management by Carles Collet, PA-C for medication Coverage (doxepin, Belsomra). Chronic conditions include HTN, HLD, chronic pain, osteopenia. Last office visit with Trinna Post, PA-C was 06/02/2019.   Was unable to reach patient via telephone today and have left HIPAA compliant voicemail asking patient to return my call. (unsuccessful outreach #1).  Follow up plan: A HIPPA compliant phone message was left for the patient providing contact information and requesting a return call.  The care management team will reach out to the patient again over the next 5-7 days.   Ruben Reason, PharmD Clinical Pharmacist [place] [phone]

## 2019-06-09 ENCOUNTER — Ambulatory Visit: Payer: Self-pay | Admitting: Pharmacist

## 2019-06-09 DIAGNOSIS — G4700 Insomnia, unspecified: Secondary | ICD-10-CM

## 2019-06-09 DIAGNOSIS — F419 Anxiety disorder, unspecified: Secondary | ICD-10-CM

## 2019-06-09 NOTE — Chronic Care Management (AMB) (Signed)
  Chronic Care Management   Note  06/09/2019 Name: Melanie White MRN: [id number] DOB: [birth year]  Melanie White is a 73 y.o. year old female who sees Trinna Post, [place] for primary care. Adriana asked the CCM team to consult the patient for assistance with chronic disease management related to medication assistance (Belsmra). Referral was placed 06/02/09. Incoming call from patient (returning CCM clinical pharmacist voicemail) HIPAA identifiers verified.  Plan: Ms. Burgo agreed to services and verbal consent obtained. I have scheduled an initial pharmacy telephone appointment for Melanie White Monday, June 22 at 10 am. Additionally, I have placed an application for Belsomra at the front desk for her to pick up tomorrow (advised patient to wear a mask in the building).    Melanie White was given information about Chronic Care Management services today including:  1. CCM service includes personalized support from designated clinical staff supervised by her physician, including individualized plan of care and coordination with other care providers 2. 24/7 contact phone numbers for assistance for urgent and routine care needs. 3. Service will only be billed when office clinical staff spend 20 minutes or more in a month to coordinate care. 4. Only one practitioner may furnish and bill the service in a calendar month. 5. The patient may stop CCM services at any time (effective at the end of the month) by phone call to the office staff. 6. The patient will be responsible for cost sharing (co-pay) of up to 20% of the service fee (after annual deductible is met).  Patient agreed to services and verbal consent obtained.     Ruben Reason, PharmD Clinical Pharmacist [place] [phone]

## 2019-06-10 ENCOUNTER — Telehealth: Payer: Self-pay | Admitting: Physician Assistant

## 2019-06-10 DIAGNOSIS — G4700 Insomnia, unspecified: Secondary | ICD-10-CM

## 2019-06-10 MED ORDER — TRAZODONE HCL 50 MG PO TABS: 25.0000 mg | ORAL_TABLET | Freq: Every evening | ORAL | 0 refills | Status: DC | PRN

## 2019-06-10 NOTE — Telephone Encounter (Signed)
Patient needs refill on Trazedone called to [facility] on [address].

## 2019-06-13 ENCOUNTER — Ambulatory Visit: Payer: Self-pay | Admitting: Pharmacist

## 2019-06-13 DIAGNOSIS — F419 Anxiety disorder, unspecified: Secondary | ICD-10-CM

## 2019-06-13 DIAGNOSIS — G4700 Insomnia, unspecified: Secondary | ICD-10-CM

## 2019-06-13 NOTE — Chronic Care Management (AMB) (Signed)
Chronic Care Management   Note  06/13/2019 Name: Melanie White MRN: [id number] DOB: 03-01-1946  Subjective:  Initial pharmacy consult for Melanie White, 21 ya female patient of Carles Collet, [place]. Referred to CCM clinical pharmacist for assistance with medication- Belsomra. Patient expresses difficulty with sleep as Melanie White will not prescribe Xanax.    Objective: Lab Results  Component Value Date   CREATININE 0.59 02/16/2017   CREATININE 0.65 11/30/2015    No results found for: HGBA1C  Lipid Panel     Component Value Date/Time   CHOL 175 02/16/2017 1604   TRIG 231 (H) 02/16/2017 1604   HDL 44 02/16/2017 1604   CHOLHDL 4.0 02/16/2017 1604   LDLCALC 85 02/16/2017 1604    BP Readings from Last 3 Encounters:  06/02/19 116/61  05/05/19 116/82  02/15/19 130/70    Allergies  Allergen Reactions  . Hydrochlorothiazide Diarrhea    Medications Reviewed Today    Reviewed by Melanie White, [facility] (Pharmacist) on 06/13/19 at 1031  Med List Status: <None>  Medication Order Taking? Sig Documenting Provider Last Dose Status Informant  amLODipine (NORVASC) 10 MG tablet [id number] Yes Take 1 tablet (10 mg total) by mouth daily. Melanie Ginsberg, PA Taking Active   busPIRone (BUSPAR) 15 MG tablet [id number] Yes Take 1 tablet (15 mg total) by mouth 2 (two) times daily. Melanie Post, PA-C Taking Active            Med Note Melanie White, Melanie White   Mon Jun 13, 2019 10:29 AM) Dose increase: two tablets AM, two tablets PM  furosemide (LASIX) 20 MG tablet [id number] No Take 1 tablet (20 mg total) by mouth daily.  Patient not taking: Reported on 06/13/2019   Melanie White, [place] Not Taking Active            Med Note Melanie White, Melanie White Jun 13, 2019 10:28 AM) One or twice per week- when feet are "tight"  meloxicam (MOBIC) 15 MG tablet [id number] No Take 1 tablet (15 mg total) by mouth daily.  Patient not taking: Reported on 06/13/2019   Melanie White, [place] Not Taking Active    metoprolol tartrate (LOPRESSOR) 50 MG tablet [id number] Yes Take 1 tablet (50 mg total) by mouth 2 (two) times daily. Melanie Post, PA-C Taking Active   montelukast (SINGULAIR) 10 MG tablet [id number] No Take 1 tablet (10 mg total) by mouth at bedtime.  Patient not taking: Reported on 06/13/2019   Melanie White, [place] Not Taking Active   pantoprazole (PROTONIX) 40 MG tablet [id number] Yes Take one daily as needed for hearburn not relieved by pantoprazole. Melanie Ginsberg, PA Taking Active            Med Note Melanie White, Melanie White Jun 13, 2019 10:27 AM) Every other day  simvastatin (ZOCOR) 40 MG tablet [id number] Yes Take 1 tablet (40 mg total) by mouth at bedtime. Melanie Post, PA-C Taking Active   Suvorexant [facility]) 10 MG TABS [id number] Yes Take 10 mg by mouth Nightly. Melanie Post, PA-C Taking Active   traZODone (DESYREL) 50 MG tablet [id number] Yes Take 0.5-1 tablets (25-50 mg total) by mouth at bedtime as needed for sleep. Melanie Post, PA-C Taking Active            Assessment:    Goals Addressed            This Visit's Progress   . I need to sleep (pt-stated)  Current Barriers:  . Was taking Xanax (dose unknown) for many years, quit cold [place] . Drinks 2 [employer]ountain Dew per day . Current every day smoker . Poor sleep hygeine: TV in bedroom, no consistent bedtime  Pharmacist Clinical Goal(s):  Marland Kitchen. Over the next 30 days, patient will work with CCM pharmacist to address needs related to insomnia . Over the next 14 days, patient will work with CCM pharmacist to gather documents needed to apply for medication assistance for Belsomra made by [employer] and prescribed by Melanie AngstAdriana White  Interventions: . Counseling on sleep hygiene o Lavender scent o Lack of screen time before bed o A night time ritual- consistent bed time, night time routine (shower, reading, brushing teeth, etc) o Reducing caffeine intake . If medically necessary, approach Melanie AngstAdriana Pollak, Pa-C  with benzodiazepine taper . Assist patient with Belsomra medication assistance paperwork  Patient Self Care Activities:  . Patient will switch to caffeine free [facility] at [employer] . Arlan OrganGather documents needed for [employer] (Belsomra) medication assistance application  Initial goal documentation        Plan: Recommendations discussed with provider - pharmacist to investigate if benzo taper medically appropriate  Recommendations discussed with patient - Please gather paperwork as discussed - Over the next 2 weeks, please switch to caffeine free Mountain Dew at 4 pm.   Follow up: Telephone follow up appointment with care management team member scheduled for: 2 weeks with PharmD  Karalee HeightJulie Venecia White, PharmD Clinical Pharmacist [facility] [phone]

## 2019-06-13 NOTE — Patient Instructions (Signed)
Sleep Hygiene Why is sleep important? - It helps to make your body ready for the next day - It helps you to organize your memory and what you have learned during the day    How do I make my sleep better? Choose one to try!    Turn off electronics (phones, computers, TV) at least 30 minutes before bed. The light from electronics makes your brain think it is time to be awake!       Don't drink caffeine in the afternoon - not after 3pm. That means no soda, energy drinks, or tea. Caffeine makes your body be more alert, not ready for bed!       If you can't sleep after 30 minutes, do something relaxing for a few minutes, then go back to bed. "Re-starting" your sleep makes your body ready to try again to start sleeping.

## 2019-07-04 ENCOUNTER — Ambulatory Visit (INDEPENDENT_AMBULATORY_CARE_PROVIDER_SITE_OTHER): Payer: Medicare HMO | Admitting: Physician Assistant

## 2019-07-04 ENCOUNTER — Other Ambulatory Visit: Payer: Self-pay

## 2019-07-04 ENCOUNTER — Ambulatory Visit: Payer: Self-pay | Admitting: Pharmacist

## 2019-07-04 ENCOUNTER — Encounter: Payer: Self-pay | Admitting: Physician Assistant

## 2019-07-04 VITALS — BP 93/67 | HR 74 | Temp 98.5°F | Resp 16 | Wt 111.8 lb

## 2019-07-04 DIAGNOSIS — F419 Anxiety disorder, unspecified: Secondary | ICD-10-CM

## 2019-07-04 DIAGNOSIS — G4700 Insomnia, unspecified: Secondary | ICD-10-CM | POA: Diagnosis not present

## 2019-07-04 DIAGNOSIS — R634 Abnormal weight loss: Secondary | ICD-10-CM

## 2019-07-04 NOTE — Patient Instructions (Addendum)
Trazodone: take 1.5 pills one hour before bed for one week. Then may take 2 pills one hour before bed for one week. You will be taking 100 mg before bed.  If it doesn't work, we can try for the low cost doxepin which will also help with sleep.   nsomnia Insomnia is a sleep disorder that makes it difficult to fall asleep or stay asleep. Insomnia can cause fatigue, low energy, difficulty concentrating, mood swings, and poor performance at work or school. There are three different ways to classify insomnia:  Difficulty falling asleep.  Difficulty staying asleep.  Waking up too early in the morning. Any type of insomnia can be long-term (chronic) or short-term (acute). Both are common. Short-term insomnia usually lasts for three months or less. Chronic insomnia occurs at least three times a week for longer than three months. What are the causes? Insomnia may be caused by another condition, situation, or substance, such as:  Anxiety.  Certain medicines.  Gastroesophageal reflux disease (GERD) or other gastrointestinal conditions.  Asthma or other breathing conditions.  Restless legs syndrome, sleep apnea, or other sleep disorders.  Chronic pain.  Menopause.  Stroke.  Abuse of alcohol, tobacco, or illegal drugs.  Mental health conditions, such as depression.  Caffeine.  Neurological disorders, such as Alzheimer's disease.  An overactive thyroid (hyperthyroidism). Sometimes, the cause of insomnia may not be known. What increases the risk? Risk factors for insomnia include:  Gender. Women are affected more often than men.  Age. Insomnia is more common as you get older.  Stress.  Lack of exercise.  Irregular work schedule or working night shifts.  Traveling between different time zones.  Certain medical and mental health conditions. What are the signs or symptoms? If you have insomnia, the main symptom is having trouble falling asleep or having trouble staying  asleep. This may lead to other symptoms, such as:  Feeling fatigued or having low energy.  Feeling nervous about going to sleep.  Not feeling rested in the morning.  Having trouble concentrating.  Feeling irritable, anxious, or depressed. How is this diagnosed? This condition may be diagnosed based on:  Your symptoms and medical history. Your health care provider may ask about: ? Your sleep habits. ? Any medical conditions you have. ? Your mental health.  A physical exam. How is this treated? Treatment for insomnia depends on the cause. Treatment may focus on treating an underlying condition that is causing insomnia. Treatment may also include:  Medicines to help you sleep.  Counseling or therapy.  Lifestyle adjustments to help you sleep better. Follow these instructions at home: Eating and drinking   Limit or avoid alcohol, caffeinated beverages, and cigarettes, especially close to bedtime. These can disrupt your sleep.  Do not eat a large meal or eat spicy foods right before bedtime. This can lead to digestive discomfort that can make it hard for you to sleep. Sleep habits   Keep a sleep diary to help you and your health care provider figure out what could be causing your insomnia. Write down: ? When you sleep. ? When you wake up during the night. ? How well you sleep. ? How rested you feel the next day. ? Any side effects of medicines you are taking. ? What you eat and drink.  Make your bedroom a dark, comfortable place where it is easy to fall asleep. ? Put up shades or blackout curtains to block light from outside. ? Use a white noise machine to block noise. ?  Keep the temperature cool.  Limit screen use before bedtime. This includes: ? Watching TV. ? Using your smartphone, tablet, or computer.  Stick to a routine that includes going to bed and waking up at the same times every day and night. This can help you fall asleep faster. Consider making a quiet  activity, such as reading, part of your nighttime routine.  Try to avoid taking naps during the day so that you sleep better at night.  Get out of bed if you are still awake after 15 minutes of trying to sleep. Keep the lights down, but try reading or doing a quiet activity. When you feel sleepy, go back to bed. General instructions  Take over-the-counter and prescription medicines only as told by your health care provider.  Exercise regularly, as told by your health care provider. Avoid exercise starting several hours before bedtime.  Use relaxation techniques to manage stress. Ask your health care provider to suggest some techniques that may work well for you. These may include: ? Breathing exercises. ? Routines to release muscle tension. ? Visualizing peaceful scenes.  Make sure that you drive carefully. Avoid driving if you feel very sleepy.  Keep all follow-up visits as told by your health care provider. This is important. Contact a health care provider if:  You are tired throughout the day.  You have trouble in your daily routine due to sleepiness.  You continue to have sleep problems, or your sleep problems get worse. Get help right away if:  You have serious thoughts about hurting yourself or someone else. If you ever feel like you may hurt yourself or others, or have thoughts about taking your own life, get help right away. You can go to your nearest emergency department or call:  Your local emergency services (911 in the [place]  A suicide crisis helpline, such as the National Suicide Prevention Lifeline at 94956613421-561-461-9956. This is open 24 hours a day. Summary  Insomnia is a sleep disorder that makes it difficult to fall asleep or stay asleep.  Insomnia can be long-term (chronic) or short-term (acute).  Treatment for insomnia depends on the cause. Treatment may focus on treating an underlying condition that is causing insomnia.  Keep a sleep diary to help you and  your health care provider figure out what could be causing your insomnia. This information is not intended to replace advice given to you by your health care provider. Make sure you discuss any questions you have with your health care provider. Document Released: 12/05/2000 Document Revised: 11/20/2017 Document Reviewed: 09/17/2017 Elsevier Patient Education  2020 [employer]

## 2019-07-04 NOTE — Chronic Care Management (AMB) (Signed)
  Chronic Care Management   Follow Up Note   07/04/2019 Name: Melanie White MRN: [id number] DOB: May 15, 1946  Referred by: Trinna Post, PA-C Reason for referral : Chronic Care Management (2 week follow up)   Melanie White is a 73 y.o. year old female who is a primary care patient of Paulene Floor. The CCM team was consulted for assistance with chronic disease management and care coordination needs.    Review of patient status, including review of consultants reports, relevant laboratory and other test results, and collaboration with appropriate care team members and the patient's provider was performed as part of comprehensive patient evaluation and provision of chronic care management services.    Goals Addressed            This Visit's Progress   . I need to sleep (pt-stated)       Current Barriers:  . Was taking Xanax (0.5mg  BID) for many years, quit cold [place] . Drinks 2 [employer] per day . Current every day smoker . Poor sleep hygeine: TV in bedroom, no consistent bedtime  Pharmacist Clinical Goal(s):  Marland Kitchen Over the next 30 days, patient will work with CCM pharmacist to address needs related to insomnia o Successfully switched to caffeine free [facility] after [place] titrating up dose of trazodone at office visit 07/04/19 . Over the next 14 days, patient will work with CCM pharmacist to gather documents needed to apply for medication assistance for Belsomra made by [employer] and prescribed by Carles Collet  Interventions: . Counseling on sleep hygiene o Lavender scent o Lack of screen time before bed o A night time ritual- consistent bed time, night time routine (shower, reading, brushing teeth, etc) o Reducing caffeine intake . If medically necessary, approach Carles Collet, Pa-C with benzodiazepine taper . Assist patient with Belsomra medication assistance paperwork  Patient Self Care Activities:  . Patient will switch to caffeine  free [facility] [place] documents needed for [employer]) medication assistance application  Please see past updates related to this goal by clicking on the "Past Updates" button in the selected goal          Telephone follow up appointment with care management team member scheduled for:1 week with PharmD to assess increased dose of trazodone and if patient has received documentation from [employer] regarding [place] program.   Ruben Reason, PharmD Clinical Pharmacist [place] [phone]

## 2019-07-04 NOTE — Progress Notes (Signed)
Patient: Melanie White Female    DOB: 10/21/1946   73 y.o.   MRN: [id number] Visit Date: 07/04/2019  Today's Provider: Trey SailorsAdriana M Pollak, PA-C   Chief Complaint  Patient presents with  . Follow-up   Subjective:     HPI  Follow up for insomnia  The patient was last seen for this 1 months ago. Changes made at last visit include start BELSOMRA. This has not been approved by insurance. CCM is helping with payment assistance. She is currently taking trazadone 50 mg at night. She reports that she gets 3-4 hours of sleep per night. She has stopped drinking mountain dew after 4 pm and thinks this may be why she gets four hours rather than 3 hours of sleep per night. Previously on Xanax for many years.  She reports poor compliance with treatment. Patient reports she could not get medication due to cost.  She feels that condition is Unchanged. She is not having side effects.   She also feels the buspar is not doing anything for her anxiety.  ------------------------------------------------------------------------------------   Allergies  Allergen Reactions  . Hydrochlorothiazide Diarrhea     Current Outpatient Medications:  .  amLODipine (NORVASC) 10 MG tablet, Take 1 tablet (10 mg total) by mouth daily., Disp: 90 tablet, Rfl: 3 .  busPIRone (BUSPAR) 15 MG tablet, Take 1 tablet (15 mg total) by mouth 2 (two) times daily., Disp: 180 tablet, Rfl: 0 .  furosemide (LASIX) 20 MG tablet, Take 1 tablet (20 mg total) by mouth daily. (Patient not taking: Reported on 06/13/2019), Disp: 90 tablet, Rfl: 3 .  meloxicam (MOBIC) 15 MG tablet, Take 1 tablet (15 mg total) by mouth daily. (Patient not taking: Reported on 06/13/2019), Disp: 90 tablet, Rfl: 3 .  metoprolol tartrate (LOPRESSOR) 50 MG tablet, Take 1 tablet (50 mg total) by mouth 2 (two) times daily., Disp: 180 tablet, Rfl: 1 .  montelukast (SINGULAIR) 10 MG tablet, Take 1 tablet (10 mg total) by mouth at bedtime. (Patient not taking:  Reported on 06/13/2019), Disp: 90 tablet, Rfl: 3 .  pantoprazole (PROTONIX) 40 MG tablet, Take one daily as needed for hearburn not relieved by pantoprazole., Disp: 90 tablet, Rfl: 1 .  simvastatin (ZOCOR) 40 MG tablet, Take 1 tablet (40 mg total) by mouth at bedtime., Disp: 90 tablet, Rfl: 3 .  Suvorexant (BELSOMRA) 10 MG TABS, Take 10 mg by mouth Nightly., Disp: 30 tablet, Rfl: 1 .  traZODone (DESYREL) 50 MG tablet, Take 0.5-1 tablets (25-50 mg total) by mouth at bedtime as needed for sleep., Disp: 90 tablet, Rfl: 0  Review of Systems  Constitutional: Negative.   Psychiatric/Behavioral: Positive for sleep disturbance.    Social History   Tobacco Use  . Smoking status: Current Every Day Smoker    Types: Cigarettes  . Smokeless tobacco: Never Used  Substance Use Topics  . Alcohol use: No      Objective:   BP 93/67 (BP Location: Left Arm, Patient Position: Sitting, Cuff Size: Normal)   Pulse 74   Temp 98.5 F (36.9 C) (Oral)   Resp 16   Wt 111 lb 12.8 oz (50.7 kg)   SpO2 96%   BMI 18.32 kg/m  Vitals:   07/04/19 1452  BP: 93/67  Pulse: 74  Resp: 16  Temp: 98.5 F (36.9 C)  TempSrc: Oral  SpO2: 96%  Weight: 111 lb 12.8 oz (50.7 kg)     Physical Exam Constitutional:  Appearance: Normal appearance.  Cardiovascular:     Rate and Rhythm: Normal rate.  Pulmonary:     Effort: Pulmonary effort is normal.  Skin:    General: Skin is warm and dry.  Neurological:     Mental Status: She is alert and oriented to person, place, and time. Mental status is at baseline.  Psychiatric:        Mood and Affect: Mood is depressed.        Behavior: Behavior normal.      No results found for any visits on 07/04/19.     Assessment & Plan    1. Insomnia, unspecified type  Increase trazadone to 100 mg per night but at the time of this writing patient wants to switch to doxepin. This was previously prescribed though for some reason prohibitively expensive. Will try different  formulation. May need psychiatrist for mood disorder.  The entirety of the information documented in the History of Present Illness, Review of Systems and Physical Exam were personally obtained by me. Portions of this information were initially documented by Lynford Humphrey, CMA and reviewed by me for thoroughness and accuracy.   F/u 1 month    Trinna Post, PA-C  [facility]

## 2019-07-04 NOTE — Patient Instructions (Signed)
Thank you for taking the time to speak with me today!   Please call a member of the CCM (Chronic Care Management) Team with any questions or case management needs:   Vanetta Mulders, BSN Nurse Care Coordinator  [phone]  Ruben Reason, PharmD  Clinical Pharmacist  [phone]  Elliot Gurney, LCSW Clinical Social Worker [phone]  Goals Addressed            This Visit's Progress   . I need to sleep (pt-stated)       Current Barriers:  . Was taking Xanax (0.5mg  BID) for many years, quit cold [place] . Drinks 2 [employer] per day . Current every day smoker . Poor sleep hygeine: TV in bedroom, no consistent bedtime  Pharmacist Clinical Goal(s):  Marland Kitchen Over the next 30 days, patient will work with CCM pharmacist to address needs related to insomnia o Successfully switched to caffeine free [facility] after [place] titrating up dose of trazodone at office visit 07/04/19 . Over the next 14 days, patient will work with CCM pharmacist to gather documents needed to apply for medication assistance for Belsomra made by [employer] and prescribed by Carles Collet  Interventions: . Counseling on sleep hygiene o Lavender scent o Lack of screen time before bed o A night time ritual- consistent bed time, night time routine (shower, reading, brushing teeth, etc) o Reducing caffeine intake . If medically necessary, approach Carles Collet, Pa-C with benzodiazepine taper . Assist patient with Belsomra medication assistance paperwork  Patient Self Care Activities:  . Patient will switch to caffeine free [employer] at [place] documents needed for [employer]) medication assistance application  Please see past updates related to this goal by clicking on the "Past Updates" button in the selected goal

## 2019-07-11 ENCOUNTER — Telehealth: Payer: Self-pay | Admitting: Physician Assistant

## 2019-07-11 ENCOUNTER — Ambulatory Visit: Payer: Self-pay | Admitting: Pharmacist

## 2019-07-11 DIAGNOSIS — G4700 Insomnia, unspecified: Secondary | ICD-10-CM

## 2019-07-11 DIAGNOSIS — F419 Anxiety disorder, unspecified: Secondary | ICD-10-CM

## 2019-07-11 DIAGNOSIS — Z720 Tobacco use: Secondary | ICD-10-CM

## 2019-07-11 MED ORDER — DOXEPIN HCL 10 MG/ML PO CONC: 3.0000 mg | Freq: Every day | ORAL | 12 refills | Status: DC

## 2019-07-11 NOTE — Telephone Encounter (Signed)
Sent in solution for doxepin 3 mg QHS. Use 3 mg in water or juice an hour before bed. Please stop trazadone if she is going to take this.

## 2019-07-11 NOTE — Patient Instructions (Signed)
Goals Addressed            This Visit's Progress   . I need to sleep (pt-stated)       Current Barriers:  . Was taking Xanax (0.5mg  BID) for many years, quit cold [place] . Drinks 2 [employer] per day . Current every day smoker . Poor sleep hygeine: TV in bedroom, no consistent bedtime  Pharmacist Clinical Goal(s):  Marland Kitchen Over the next 30 days, patient will work with CCM pharmacist to address needs related to insomnia o Successfully switched to caffeine free [facility] after [place] titrating up dose of trazodone at office visit 07/04/19 o Trial of liquid doxepin prescribed by Carles Collet for sleep . Over the next 14 days, patient will work with CCM pharmacist to gather documents needed to apply for medication assistance for Belsomra made by [employer] and prescribed by Carles Collet  Interventions: . Counseling on sleep hygiene o Lavender scent o Lack of screen time before bed o A night time ritual- consistent bed time, night time routine (shower, reading, brushing teeth, etc) o Reducing caffeine intake . If medically necessary, approach Carles Collet, Pa-C with benzodiazepine taper . Assist patient with Belsomra medication assistance paperwork  Patient Self Care Activities:  . Patient will switch to caffeine free [employer] at [place] documents needed for [employer]) medication assistance application  Please see past updates related to this goal by clicking on the "Past Updates" button in the selected goal

## 2019-07-11 NOTE — Chronic Care Management (AMB) (Signed)
  Chronic Care Management   Follow Up Note   07/11/2019 Name: Melanie White MRN: [id number] DOB: May 13, 1946  Subjective Melanie White is a 73 y.o. year old female who is a primary care patient of Paulene Floor. The CCM clinical pharmacist following up today via telephone to assess efficacy of trazodone titration. HIPAA identifiers verified.    Assessment: Review of patient status, including review of consultants reports, relevant laboratory and other test results, and collaboration with appropriate care team members and the patient's provider was performed as part of comprehensive patient evaluation and provision of chronic care management services.    Goals Addressed            This Visit's Progress   . I need to sleep (pt-stated)       Current Barriers:  . Was taking Xanax (0.5mg  BID) for many years, quit cold [place] . Drinks 2 [employer] per day . Current every day smoker . Poor sleep hygeine: TV in bedroom, no consistent bedtime  Pharmacist Clinical Goal(s):  Marland Kitchen Over the next 30 days, patient will work with CCM pharmacist to address needs related to insomnia o Successfully switched to caffeine free [facility] after [place] titrating up dose of trazodone at office visit 07/04/19 o Trial of liquid doxepin prescribed by Carles Collet for sleep . Over the next 14 days, patient will work with CCM pharmacist to gather documents needed to apply for medication assistance for Belsomra made by [employer] and prescribed by Carles Collet  Interventions: . Counseling on sleep hygiene o Lavender scent o Lack of screen time before bed o A night time ritual- consistent bed time, night time routine (shower, reading, brushing teeth, etc) o Reducing caffeine intake . If medically necessary, approach Carles Collet, Pa-C with benzodiazepine taper . Assist patient with Belsomra medication assistance paperwork  Patient Self Care Activities:  . Patient will switch to  caffeine free [employer] at [place] documents needed for [employer]) medication assistance application  Please see past updates related to this goal by clicking on the "Past Updates" button in the selected goal         Plan: Patient to DC trazodone, start doxepin 0.3 mL by mouth at bedtime.  Monitor mail for application from [employer], sign and return to [employer].   Follow up Telephone follow up appointment with care management team member scheduled for: 2 weeks with PharmD   Ruben Reason, PharmD Clinical Pharmacist [place] [phone]

## 2019-07-12 ENCOUNTER — Telehealth: Payer: Self-pay | Admitting: Physician Assistant

## 2019-07-12 NOTE — Telephone Encounter (Signed)
LVMTRC 

## 2019-07-13 ENCOUNTER — Other Ambulatory Visit: Payer: Self-pay | Admitting: Physician Assistant

## 2019-07-13 DIAGNOSIS — G4700 Insomnia, unspecified: Secondary | ICD-10-CM

## 2019-07-13 MED ORDER — DOXEPIN HCL 10 MG/ML PO CONC: 3.0000 mg | Freq: Every day | ORAL | 12 refills | Status: DC

## 2019-07-13 NOTE — Telephone Encounter (Signed)
Your patient 

## 2019-07-13 NOTE — Telephone Encounter (Signed)
Pt needing her doxepin (SINEQUAN) 10 MG/ML solution filled.  [facility] will be sending prior aurth to fill.  Thanks, [employer]

## 2019-07-13 NOTE — Telephone Encounter (Signed)
Patient was advised.  

## 2019-07-25 ENCOUNTER — Telehealth: Payer: Self-pay

## 2019-07-26 ENCOUNTER — Telehealth: Payer: Self-pay | Admitting: Physician Assistant

## 2019-07-26 NOTE — Telephone Encounter (Signed)
Doxepin approved. Patient advised.

## 2019-07-26 NOTE — Telephone Encounter (Signed)
Needing to know if this medication was approved - doxepin (SINEQUAN) 10 MG/ML solution.  Pt is still not sleeping.  Please advise.  Thanks, [employer]

## 2019-07-27 ENCOUNTER — Telehealth: Payer: Self-pay

## 2019-07-27 ENCOUNTER — Ambulatory Visit: Payer: Self-pay | Admitting: Pharmacist

## 2019-07-27 DIAGNOSIS — F419 Anxiety disorder, unspecified: Secondary | ICD-10-CM

## 2019-07-27 DIAGNOSIS — Z720 Tobacco use: Secondary | ICD-10-CM

## 2019-07-27 DIAGNOSIS — G4700 Insomnia, unspecified: Secondary | ICD-10-CM

## 2019-07-27 NOTE — Chronic Care Management (AMB) (Signed)
  Chronic Care Management   Note  07/27/2019 Name: Melanie White MRN: [id number] DOB: 12-29-1945  73 y.o. year old female referred to Chronic Care Management by Carles Collet, PA-C for medication management and medication assistance. Follow up today regarding [employer] application and efficacy of new doxepin prescription for sleep.   Was unable to reach patient via telephone today and have left HIPAA compliant voicemail asking patient to return my call. (unsuccessful outreach #1).  Follow up plan: A HIPPA compliant phone message was left for the patient providing contact information and requesting a return call.  The care management team will reach out to the patient again over the next 5-7 days.   Ruben Reason, PharmD Clinical Pharmacist [place] [phone]

## 2019-08-01 ENCOUNTER — Ambulatory Visit: Payer: Self-pay | Admitting: Pharmacist

## 2019-08-01 DIAGNOSIS — G4700 Insomnia, unspecified: Secondary | ICD-10-CM

## 2019-08-01 DIAGNOSIS — F419 Anxiety disorder, unspecified: Secondary | ICD-10-CM

## 2019-08-02 NOTE — Chronic Care Management (AMB) (Signed)
  Chronic Care Management   Follow Up Note   08/02/2019 Name: Melanie White MRN: [id number] DOB: 1946-11-18  Subjective Melanie White is a 73 y.o. year old female who is a primary care patient of Melanie White. The CCM clinical pharmacist following up today via telephone to assess efficacy of trazodone titration. HIPAA identifiers verified.    Assessment: Review of patient status, including review of consultants reports, relevant laboratory and other test results, and collaboration with appropriate care team members and the patient's provider was performed as part of comprehensive patient evaluation and provision of chronic care management services.    Goals Addressed            This Visit's Progress   . I need to sleep (pt-stated)       Current Barriers:  . Was taking Xanax (0.5mg  BID) for many years, quit cold [place] . Drinks 2 [employer] per day . Current every day smoker . Poor sleep hygeine: TV in bedroom, no consistent bedtime  Pharmacist Clinical Goal(s):  Marland Kitchen Over the next 30 days, patient will work with CCM pharmacist to address needs related to insomnia o Successfully switched to caffeine free Melanie White after [place] titrating up dose of trazodone at office visit 07/04/19 o Trial of liquid doxepin prescribed by Melanie White for sleep . Over the next 14 days, patient will work with CCM pharmacist to gather documents needed to apply for medication assistance for Belsomra made by Melanie White and prescribed by Melanie White   Interventions: . Counseling on sleep hygiene o Lavender scent o Lack of screen time before bed o A night time ritual- consistent bed time, night time routine (shower, reading, brushing teeth, etc) o Reducing caffeine intake - Patient states she has now switched to caffeine free [facility] . If medically necessary, approach Melanie Collet, Pa-C with benzodiazepine taper .  Marland Kitchen Assist patient with Belsomra medication  assistance paperwork o Provided patient with Melanie White telephone number to follow up on her application as she has not received attestation letter  Patient Self Care Activities:  . Patient will switch to caffeine free [employer] at [place] documents needed for Melanie White) medication assistance application  Please see past updates related to this goal by clicking on the "Past Updates" button in the selected goal         Plan: Start doxepin 0.3 mL by mouth with a snack at least 30 min to 2 hours before bedtime.   Monitor mail for application from Melanie White, sign and return to Melanie White. Contact Melanie White with phone number provided for status update.  Follow up Telephone follow up appointment with care management team member scheduled for: 2 weeks with PharmD   Melanie White, PharmD Clinical Pharmacist [place] [phone]

## 2019-08-09 ENCOUNTER — Other Ambulatory Visit (INDEPENDENT_AMBULATORY_CARE_PROVIDER_SITE_OTHER): Payer: Medicare HMO | Admitting: Physician Assistant

## 2019-08-09 DIAGNOSIS — I10 Essential (primary) hypertension: Secondary | ICD-10-CM | POA: Diagnosis not present

## 2019-08-09 MED ORDER — AMLODIPINE BESYLATE 10 MG PO TABS: 10.0000 mg | ORAL_TABLET | Freq: Every day | ORAL | 1 refills | Status: DC

## 2019-08-09 NOTE — Telephone Encounter (Signed)
[facility] faxed refill request for the following medications:   amLODipine (NORVASC) 10 MG tablet   Please advise.  

## 2019-08-15 ENCOUNTER — Ambulatory Visit: Payer: Self-pay | Admitting: Pharmacist

## 2019-08-15 ENCOUNTER — Telehealth: Payer: Self-pay | Admitting: Physician Assistant

## 2019-08-15 DIAGNOSIS — G4700 Insomnia, unspecified: Secondary | ICD-10-CM

## 2019-08-15 DIAGNOSIS — Z720 Tobacco use: Secondary | ICD-10-CM

## 2019-08-15 DIAGNOSIS — F419 Anxiety disorder, unspecified: Secondary | ICD-10-CM

## 2019-08-15 NOTE — Telephone Encounter (Signed)
Speaking with patient, she increased her BUSPAR to 1 pill TID yesterday= took extra dose at lunch time.   Ruben Reason, PharmD Clinical Pharmacist [place] [phone]

## 2019-08-15 NOTE — Telephone Encounter (Signed)
Called patient, no answer 

## 2019-08-15 NOTE — Telephone Encounter (Signed)
°  Pt calling regarding the doxepin (SINEQUAN) 10 MG/ML solution. She increased to 3 due to having more anxiety attacks.  Please call pt back to discuss.  Thanks, [employer]

## 2019-08-18 NOTE — Chronic Care Management (AMB) (Signed)
  Chronic Care Management   Follow Up Note   08/18/2019 Name: DOUGLAS ROOKS MRN: [id number] DOB: 1946/10/07  Subjective Melanie White is a 73 y.o. year old female who is a primary care patient of Paulene Floor. The CCM clinical pharmacist following up today via telephone to assess efficacy of new doxepin SL for sleep/anxiety. HIPAA identifiers verified.    Assessment: Review of patient status, including review of consultants reports, relevant laboratory and other test results, and collaboration with appropriate care team members and the patient's provider was performed as part of comprehensive patient evaluation and provision of chronic care management services.    Patient states that over the weekend she had an "anxiety attack" and just didn't feel right. She called her pharmacy and the pharmacist told her it would be okay to take another buspirone at midday, which she did, but it did not help. She does not think the doxepin has helped at all and in fact thinks it has contributed to "not feeling right". Is unsure if she is allergic to doxepin, though in this clinician's opinion, she is not describing an allergic reaction. Patient is very vocal about frustrations with her insomnia and anxiety. States that no medication but Xanax works for her. Suggest that patient speaks with Fabio Bering about a referral to psychiatry so that she can seek a specialists opinion about these ongoing and obviously severe issues.   Goals Addressed            This Visit's Progress   . I need to sleep (pt-stated)       Current Barriers:  . Was taking Xanax (0.5mg  BID) for many years, quit cold [place] . Drinks 2 [employer] per day . Current every day smoker . Poor sleep hygeine: TV in bedroom, no consistent bedtime  Pharmacist Clinical Goal(s):  Marland Kitchen Over the next 30 days, patient will work with CCM pharmacist to address needs related to insomnia o Successfully switched to caffeine free [facility]  after [place] titrating up dose of trazodone at office visit 07/04/19 o Trial of liquid doxepin prescribed by Carles Collet for sleep . Over the next 14 days, patient will work with CCM pharmacist to gather documents needed to apply for medication assistance for Belsomra made by [employer] and prescribed by Carles Collet   Interventions: . Counseling on sleep hygiene o Lavender scent o Lack of screen time before bed o A night time ritual- consistent bed time, night time routine (shower, reading, brushing teeth, etc) o Reducing caffeine intake - Patient states she has now switched to caffeine free [facility] . If medically necessary, approach Carles Collet, Pa-C with benzodiazepine taper  . Assist patient with Belsomra medication assistance paperwork o Provided patient with [employer] telephone number to follow up on her application as she has not received attestation letter  Patient Self Care Activities:  . Patient will switch to caffeine free [place] at [place] documents needed for [employer]) medication assistance application  Please see past updates related to this goal by clicking on the "Past Updates" button in the selected goal          Follow up Telephone follow up appointment with care management team member scheduled for: 2 weeks with PharmD   Ruben Reason, PharmD Clinical Pharmacist [place] [phone]

## 2019-08-23 ENCOUNTER — Other Ambulatory Visit: Payer: Self-pay

## 2019-08-23 ENCOUNTER — Ambulatory Visit (INDEPENDENT_AMBULATORY_CARE_PROVIDER_SITE_OTHER): Payer: Medicare HMO | Admitting: Physician Assistant

## 2019-08-23 ENCOUNTER — Encounter: Payer: Self-pay | Admitting: Physician Assistant

## 2019-08-23 VITALS — BP 126/77 | HR 83 | Temp 96.9°F | Resp 16 | Wt 112.0 lb

## 2019-08-23 DIAGNOSIS — F419 Anxiety disorder, unspecified: Secondary | ICD-10-CM | POA: Diagnosis not present

## 2019-08-23 DIAGNOSIS — N3090 Cystitis, unspecified without hematuria: Secondary | ICD-10-CM

## 2019-08-23 DIAGNOSIS — R69 Illness, unspecified: Secondary | ICD-10-CM | POA: Diagnosis not present

## 2019-08-23 LAB — POCT URINALYSIS DIPSTICK
Bilirubin, UA: NEGATIVE
Glucose, UA: NEGATIVE
Ketones, UA: NEGATIVE
Nitrite, UA: NEGATIVE
Protein, UA: NEGATIVE
Spec Grav, UA: 1.01 (ref 1.010–1.025)
Urobilinogen, UA: 0.2 E.U./dL
pH, UA: 6 (ref 5.0–8.0)

## 2019-08-23 MED ORDER — CIPROFLOXACIN HCL 250 MG PO TABS: 250.0000 mg | ORAL_TABLET | Freq: Two times a day (BID) | ORAL | 0 refills | Status: DC

## 2019-08-23 MED ORDER — SERTRALINE HCL 50 MG PO TABS: 50.0000 mg | ORAL_TABLET | Freq: Every day | ORAL | 0 refills | Status: DC

## 2019-08-23 NOTE — Progress Notes (Signed)
Patient: Melanie White Female    DOB: Oct 07, 1946   73 y.o.   MRN: [id number] Visit Date: 08/23/2019  Today's Provider: Trinna Post, PA-C   Chief Complaint  Patient presents with  . Urinary Tract Infection   Subjective:     HPI Urinary Tract Infection: Patient complains of burning with urination She has had symptoms for 1 week. Patient also complains of none. Patient denies fever and vaginal discharge. Patient does not have a history of recurrent UTI.  Patient does not have a history of pyelonephritis. Last urine culture 05/06/2019 showed 7000 CFU Psuedomonas but patient was symptomatic and treated with cipro 250 mg BID x 3 days with good results.   She is wondering if a UTI can cause an anxiety attack. She reports these have been happening more frequently. She has increased her buspar to 15 mg TID after calling pharmacist on the weekend and determining if this is OK. She continues to sleep poorly. At most, she will sleep 5 hours of night. She is currently using a liquid formulation of doxepin which she feels works 50% of the times. Some nights she will sleep 3-4 hours at night. She was previously on xanax for anxiety and sleep and reports that her mother and all of her mother's siblings were on Xanax. Father with depression. She lives alone and her sister who has lived with her for 12 years has recently passed away within the past year. She does not have contact with anybody and will watch TV all day. She feels negatively affected by the current events.    Allergies  Allergen Reactions  . Hydrochlorothiazide Diarrhea     Current Outpatient Medications:  .  amLODipine (NORVASC) 10 MG tablet, Take 1 tablet (10 mg total) by mouth daily., Disp: 90 tablet, Rfl: 1 .  busPIRone (BUSPAR) 15 MG tablet, Take 1 tablet (15 mg total) by mouth 2 (two) times daily., Disp: 180 tablet, Rfl: 0 .  doxepin (SINEQUAN) 10 MG/ML solution, Take 0.3 mLs (3 mg total) by mouth at bedtime., Disp: 120  mL, Rfl: 12 .  metoprolol tartrate (LOPRESSOR) 50 MG tablet, Take 1 tablet (50 mg total) by mouth 2 (two) times daily., Disp: 180 tablet, Rfl: 1 .  pantoprazole (PROTONIX) 40 MG tablet, Take one daily as needed for hearburn not relieved by pantoprazole., Disp: 90 tablet, Rfl: 1 .  simvastatin (ZOCOR) 40 MG tablet, Take 1 tablet (40 mg total) by mouth at bedtime., Disp: 90 tablet, Rfl: 3  Review of Systems  Constitutional: Negative.   Genitourinary: Positive for dysuria. Negative for hematuria, pelvic pain and vaginal discharge.    Social History   Tobacco Use  . Smoking status: Current Every Day Smoker    Types: Cigarettes  . Smokeless tobacco: Never Used  Substance Use Topics  . Alcohol use: No      Objective:   BP 126/77 (BP Location: Left Arm, Patient Position: Sitting, Cuff Size: Normal)   Pulse 83   Temp (!) 96.9 F (36.1 C) (Temporal)   Resp 16   Wt 112 lb (50.8 kg)   BMI 18.35 kg/m  Vitals:   08/23/19 1048  BP: 126/77  Pulse: 83  Resp: 16  Temp: (!) 96.9 F (36.1 C)  TempSrc: Temporal  Weight: 112 lb (50.8 kg)  Body mass index is 18.35 kg/m.   Physical Exam Constitutional:      Comments: Frail appearing older woman.   Cardiovascular:     Rate  and Rhythm: Normal rate and regular rhythm.     Heart sounds: Normal heart sounds.  Pulmonary:     Effort: Pulmonary effort is normal.     Breath sounds: Normal breath sounds.  Abdominal:     General: Abdomen is flat.     Palpations: Abdomen is soft.  Skin:    General: Skin is warm and dry.  Neurological:     Mental Status: She is alert and oriented to person, place, and time. Mental status is at baseline.  Psychiatric:        Mood and Affect: Mood normal.        Behavior: Behavior normal.      No results found for any visits on 08/23/19.     Assessment & Plan    1. Cystitis  Will treat empirically as below based on prior cx.   - CULTURE, URINE COMPREHENSIVE - ciprofloxacin (CIPRO) 250 MG tablet;  Take 1 tablet (250 mg total) by mouth 2 (two) times daily.  Dispense: 6 tablet; Refill: 0  2. Anxiety  Start zoloft as below. Will refer for counseling. Consider psychiatry. Counseled it will take several weeks for zoloft to work. Telephone f/u in 4 weeks to assess.  - sertraline (ZOLOFT) 50 MG tablet; Take 1 tablet (50 mg total) by mouth daily.  Dispense: 90 tablet; Refill: 0 - Ambulatory referral to Chronic Care Management Services  The entirety of the information documented in the History of Present Illness, Review of Systems and Physical Exam were personally obtained by me. Portions of this information were initially documented by Rondel BatonSulibeya Dimas, CMA and reviewed by me for thoroughness and accuracy.       Trey SailorsAdriana M Pollak, PA-C  [facility]

## 2019-08-23 NOTE — Patient Instructions (Signed)

## 2019-08-23 NOTE — Addendum Note (Signed)
Addended by: Shawna Orleans on: 08/23/2019 11:36 AM   Modules accepted: Orders

## 2019-08-25 ENCOUNTER — Ambulatory Visit: Payer: Self-pay | Admitting: Pharmacist

## 2019-08-25 DIAGNOSIS — F419 Anxiety disorder, unspecified: Secondary | ICD-10-CM

## 2019-08-25 DIAGNOSIS — G4700 Insomnia, unspecified: Secondary | ICD-10-CM

## 2019-08-25 LAB — CULTURE, URINE COMPREHENSIVE

## 2019-08-25 NOTE — Patient Instructions (Signed)
  Thank you allowing the Chronic Care Management Team to be a part of your care!   Goals Addressed            This Visit's Progress   . I need to sleep (pt-stated)       Current Barriers:  . Was taking Xanax (0.5mg  BID) for many years, quit cold [place] . Drinks 2 Colgate per day . Current every day smoker . Poor sleep hygeine: TV in bedroom, no consistent bedtime  Pharmacist Clinical Goal(s):  Marland Kitchen Over the next 30 days, patient will work with CCM pharmacist to address needs related to insomnia o Successfully switched to caffeine free [facility] after [place] titrating up dose of trazodone at office visit 07/04/19 o Trial of liquid doxepin prescribed by Carles Collet for sleep . Over the next 14 days, patient will work with CCM pharmacist to gather documents needed to apply for medication assistance for Belsomra made by [employer] and prescribed by Carles Collet   Interventions: . Counseling on sleep hygiene o Lavender scent o Lack of screen time before bed o A night time ritual- consistent bed time, night time routine (shower, reading, brushing teeth, etc) o Reducing caffeine intake - Patient states she has now switched to caffeine free [facility] . Assist patient with Belsomra medication assistance paperwork o Provided patient with [employer] telephone number to follow up on her application as she has not received attestation letter . Updated 9/3: Patient states she slept 6+ hours last night; this is the first time she has reported >3 hours of sleep at night at a pharmacy appointment;   Patient Self Care Activities:  . Patient will switch to caffeine free [employer] at [place] documents needed for [employer]) medication assistance application  Please see past updates related to this goal by clicking on the "Past Updates" button in the selected goal      . New Medication counseling: Sertraline (pt-stated)       Current Barriers:  Marland Kitchen Knowledge deficit  related to purpose and mechanism of medications taken for anxiety and depression  Pharmacist Clinical Goal(s): Over the 30 days, Ms.Milus Mallick will verbalize understanding of medication plan as it relates to her plan of care.   Interventions: . Patient educated on purpose, proper use and potential adverse effects of sertraline o Cautioned patient that it can take up 4 weeks for full effect though she may see some symptom relief in about 2 weeks   Patient Self Care Activities:  . Take all medications as prescribed . Contact provider if diarrhea persists >10 days  Initial goal documentation

## 2019-08-25 NOTE — Chronic Care Management (AMB) (Signed)
Chronic Care Management   Follow Up Note   08/25/2019 Name: Melanie White MRN: [id number] DOB: 08-03-1946  Subjective [name] is a [age] y.o. year old female who is a primary care patient of Maryella Shiversollak, Adriana M, PA-C. The CCM clinical pharmacist following up today via telephone to assess pharmacy goals. HIPAA identifiers verified.    Assessment: Review of patient status, including review of consultants reports, relevant laboratory and other test results, and collaboration with appropriate care team members and the patient's provider was performed as part of comprehensive patient evaluation and provision of chronic care management services.    Patient's mood and affect are markedly better throughout appointment compared to previous appointments. She feels very positive about sertraline. Is experiencing some diarrhea, unknown at this point if it is related to setraline, cipro course for UTI, or her ongoing "stomach issues". Taking OTC immodium. Recommended probiotic with 3+ strains of flora once she finishes cipro. Call office in 7-10 days if diarrhea unresolved  Goals Addressed            This Visit's Progress   . I need to sleep (pt-stated)       Current Barriers:  . Was taking Xanax (0.5mg  BID) for many years, quit cold [place] . Drinks 2 [employer]ountain Dew per day . Current every day smoker . Poor sleep hygeine: TV in bedroom, no consistent bedtime  Pharmacist Clinical Goal(s):  Marland Kitchen. Over the next 30 days, patient will work with CCM pharmacist to address needs related to insomnia o [facility]fully [facility] 4[facility] o Osvaldo Angstdriana Pollak titrating up dose of trazodone at office visit 07/04/19 o Trial of liquid doxepin prescribed by Osvaldo AngstAdriana Pollak for sleep . Over the next 14 days, patient will work with CCM pharmacist to gather documents needed to apply for medication assistance for Belsomra made by [employer] and prescribed by Osvaldo AngstAdriana Pollak   Interventions: .  Counseling on sleep hygiene o Lavender scent o Lack of screen time before bed o A night time ritual- consistent bed time, night time routine (shower, reading, brushing teeth, etc) o Reducing caffeine intake - Patient states she [facility] now [facility] . Assist patient with Belsomra medication assistance paperwork o Provided patient with [employer] telephone number to follow up on her application as she has not received attestation letter . Updated 9/3: Patient states she slept 6+ hours last night; this is the first time she has reported >3 hours of sleep at night at a pharmacy appointment;   Patient Self Care Activities:  . Patient will switch to caffeine free [employer] at [employer] . Arlan OrganGather documents needed for [employer] (Belsomra) medication assistance application  Please see past updates related to this goal by clicking on the "Past Updates" button in the selected goal      . New Medication counseling: Sertraline (pt-stated)       Current Barriers:  Marland Kitchen. Knowledge deficit related to purpose and mechanism of medications taken for anxiety and depression  Pharmacist Clinical Goal(s): Over the 30 days, [name] will verbalize understanding of medication plan as it relates to her plan of care.   Interventions: . Patient educated on purpose, proper use and potential adverse effects of sertraline o Cautioned patient that it can take up 4 weeks for full effect though she may see some symptom relief in about 2 weeks   Patient Self Care Activities:  . Take all medications as prescribed . Contact provider if diarrhea persists >10  days  Initial goal documentation         Follow up Telephone follow up appointment with care management team member scheduled for: 2 weeks with PharmD   Ruben Reason, PharmD Clinical Pharmacist [place] [phone]

## 2019-08-26 ENCOUNTER — Telehealth: Payer: Self-pay

## 2019-08-26 NOTE — Telephone Encounter (Signed)
-----   Message from Trinna Post, [place] sent at 08/25/2019  4:40 PM EDT ----- Urine culture did not show growth. Please stop antibiotics if she is still taking.

## 2019-08-26 NOTE — Telephone Encounter (Signed)
Pt advised.   Thanks,   -Adolph Clutter  

## 2019-08-30 ENCOUNTER — Ambulatory Visit: Payer: Self-pay | Admitting: *Deleted

## 2019-08-30 NOTE — Chronic Care Management (AMB) (Signed)
   Chronic Care Management   Unsuccessful Call Note 08/30/2019 Name: Melanie White MRN: [id number] DOB: 19-Jan-1946  Patient  is a 73 year old female who sees Carles Collet PA-C for primary care. Carles Collet, PA-C asked the CCM team to consult the patient for Mental Health Counseling and Resources.  Patient is current active with CCM pharmacist Ruben Reason. Referral was placed on 08/23/19. Patient's last office visit was 08/23/19.     This social was unable to reach patient via telephone today to discuss her mental health needs. I have left HIPAA compliant voicemail asking patient to return my call. (unsuccessful outreach #1).   Plan: Will follow-up within 7 business days via telephone.     Elliot Gurney, [place] Worker  [facility] Care Management [phone]

## 2019-09-01 ENCOUNTER — Ambulatory Visit: Payer: Self-pay | Admitting: *Deleted

## 2019-09-01 NOTE — Chronic Care Management (AMB) (Signed)
  Chronic Care Management   Social Work Note  09/01/2019 Name: KAITLIN ALCINDOR MRN: [id number] DOB: 10/28/1946  Quincy Simmonds Boger is a 73 y.o. year old female who sees Trinna Post, [place] for primary care. The CCM team was consulted for assistance with Mental Health Counseling and Resources.  Phone call to patient to assess her for mental health and community resource needs. Per patient, she feels that she ha made progress and doing much better. Patient declined need for mental health support at this time.      Outpatient Encounter Medications as of 09/01/2019  Medication Sig Note  . amLODipine (NORVASC) 10 MG tablet Take 1 tablet (10 mg total) by mouth daily.   . ciprofloxacin (CIPRO) 250 MG tablet Take 1 tablet (250 mg total) by mouth 2 (two) times daily. 9/3/2020Elizebeth Koller 08/26/19  . doxepin (SINEQUAN) 10 MG/ML solution Take 0.3 mLs (3 mg total) by mouth at bedtime.   . metoprolol tartrate (LOPRESSOR) 50 MG tablet Take 1 tablet (50 mg total) by mouth 2 (two) times daily.   . pantoprazole (PROTONIX) 40 MG tablet Take one daily as needed for hearburn not relieved by pantoprazole. 06/13/2019: Every other day  . sertraline (ZOLOFT) 50 MG tablet Take 1 tablet (50 mg total) by mouth daily.   . simvastatin (ZOCOR) 40 MG tablet Take 1 tablet (40 mg total) by mouth at bedtime.    No facility-administered encounter medications on file as of 09/01/2019.     Goals Addressed   None     Follow Up Plan: Client will follow up with this social worker if mental health needs arise in the future   Remona Boom, [place] Worker  [facility] Care Management [phone]

## 2019-09-19 ENCOUNTER — Ambulatory Visit: Payer: Self-pay | Admitting: Pharmacist

## 2019-09-19 DIAGNOSIS — F419 Anxiety disorder, unspecified: Secondary | ICD-10-CM

## 2019-09-19 DIAGNOSIS — G4700 Insomnia, unspecified: Secondary | ICD-10-CM

## 2019-09-22 ENCOUNTER — Other Ambulatory Visit: Payer: Self-pay

## 2019-09-22 ENCOUNTER — Ambulatory Visit (INDEPENDENT_AMBULATORY_CARE_PROVIDER_SITE_OTHER): Payer: Medicare HMO | Admitting: Physician Assistant

## 2019-09-22 ENCOUNTER — Encounter: Payer: Self-pay | Admitting: Physician Assistant

## 2019-09-22 DIAGNOSIS — F419 Anxiety disorder, unspecified: Secondary | ICD-10-CM

## 2019-09-22 DIAGNOSIS — R69 Illness, unspecified: Secondary | ICD-10-CM | POA: Diagnosis not present

## 2019-09-22 DIAGNOSIS — F329 Major depressive disorder, single episode, unspecified: Secondary | ICD-10-CM

## 2019-09-22 DIAGNOSIS — F32A Depression, unspecified: Secondary | ICD-10-CM

## 2019-09-22 NOTE — Patient Instructions (Signed)

## 2019-09-22 NOTE — Progress Notes (Signed)
Patient: Melanie White Female    DOB: Mar 02, 1946   73 y.o.   MRN: [id number] Visit Date: 09/22/2019  Today's Provider: Trinna Post, PA-C   Chief Complaint  Patient presents with  . Anxiety   Subjective:    Virtual Visit via Telephone Note  I connected with Melanie White on 09/22/19 at  8:40 AM EDT by telephone and verified that I am speaking with the correct person using two identifiers.   I discussed the limitations, risks, security and privacy concerns of performing an evaluation and management service by telephone and the availability of in person appointments. I also discussed with the patient that there may be a patient responsible charge related to this service. The patient expressed understanding and agreed to proceed.  Patient location: home Provider location: [place] office  Persons involved in the visit: patient, provider    Anxiety Patient presented initially in 04/2019 transferring care from a different provider. She was managed on Xanax as monotherapy chronically for anxiety for many years. She was tapered off Xanax and prescribed Buspar which did not help. She was given trazadone for sleep which she reported did not help. She was also given doxepin for sleep which she reports causes GI distress. Last month, she was started on 50 mg zoloft daily.   Today, she reports she is doing much better taking the zoloft 50 mg and she hasn't had a depression or anxiety attack since starting this. She feels a lot better taking this and says she feels like herself. She reports she feels drowsy during the day taking this and will sometimes take a two hour nap. She would like to know if she can take this at night. She thinks this would help her sleep and then she would be able to stop the doxepin because of the GI side effects.   From 08/23/2019    Start zoloft as below. Will refer for counseling. Consider psychiatry. Counseled it will take several  weeks for zoloft to work. Telephone f/u in 4 weeks to assess.  Anxiety Presents for follow-up visit. Symptoms include nausea. Patient reports no decreased concentration, depressed mood, excessive worry, insomnia, nervous/anxious behavior, panic, shortness of breath or suicidal ideas. The quality of sleep is good. Nighttime awakenings: none.        Allergies  Allergen Reactions  . Hydrochlorothiazide Diarrhea     Current Outpatient Medications:  .  amLODipine (NORVASC) 10 MG tablet, Take 1 tablet (10 mg total) by mouth daily., Disp: 90 tablet, Rfl: 1 .  doxepin (SINEQUAN) 10 MG/ML solution, Take 0.3 mLs (3 mg total) by mouth at bedtime., Disp: 120 mL, Rfl: 12 .  metoprolol tartrate (LOPRESSOR) 50 MG tablet, Take 1 tablet (50 mg total) by mouth 2 (two) times daily., Disp: 180 tablet, Rfl: 1 .  pantoprazole (PROTONIX) 40 MG tablet, Take one daily as needed for hearburn not relieved by pantoprazole., Disp: 90 tablet, Rfl: 1 .  sertraline (ZOLOFT) 50 MG tablet, Take 1 tablet (50 mg total) by mouth daily., Disp: 90 tablet, Rfl: 0 .  simvastatin (ZOCOR) 40 MG tablet, Take 1 tablet (40 mg total) by mouth at bedtime., Disp: 90 tablet, Rfl: 3 .  ciprofloxacin (CIPRO) 250 MG tablet, Take 1 tablet (250 mg total) by mouth 2 (two) times daily., Disp: 6 tablet, Rfl: 0  Review of Systems  Constitutional: Positive for fatigue. Negative for activity change, appetite change, chills, diaphoresis, fever and unexpected weight change.  Respiratory:  Negative for shortness of breath.   Gastrointestinal: Positive for abdominal distention, diarrhea and nausea. Negative for abdominal pain, anal bleeding, blood in stool, constipation, rectal pain and vomiting.  Psychiatric/Behavioral: Negative for decreased concentration and suicidal ideas. The patient is not nervous/anxious and does not have insomnia.     Social History   Tobacco Use  . Smoking status: Current Every Day Smoker    Types: Cigarettes  .  Smokeless tobacco: Never Used  Substance Use Topics  . Alcohol use: No      Objective:   There were no vitals taken for this visit. There were no vitals filed for this visit.There is no height or weight on file to calculate BMI.   Physical Exam Pulmonary:     Effort: No respiratory distress.  Psychiatric:     Comments: Noticeably brighter mood today, patient talkative about a wide variety of topics.       No results found for any visits on 09/22/19.     Assessment & Plan    1. Anxiety  Patient reports significant improvement with zoloft. She is noticeably much brighter in her mood. We had a long conversation about multiple topics including her love for music, going to town dances as a teenager, among many others. I think it would be a great idea for her to try taking zoloft before bed since it seems to make her drowsy. If she did feel like she needed an increase in zoloft, she may take two tablets daily. She may also stop the doxepin. I think we should see her in ~4 months for routine follow up and labs, would like to keep her out of the clinic during flu season. She asks about the flu shot and she would like to get this, explained she may get it here or at pharmacy. She plans to get it at [facility] pharmacy.   2. Depression, unspecified depression type  The entirety of the information documented in the History of Present Illness, Review of Systems and Physical Exam were personally obtained by me. Portions of this information were initially documented by Kavin Leech, CMA and reviewed by me for thoroughness and accuracy.        Trey Sailors, PA-C  [facility]

## 2019-09-23 NOTE — Chronic Care Management (AMB) (Signed)
  Chronic Care Management   Care Coordination Note  09/23/2019- late entry Name: Melanie White MRN: [id number] DOB: July 04, 1946  Care coordination- spoke with representative from [place], stating that patient's Belsomra application and attestation was mailed back to [facility] on 7/28 because Adriana's licencse number was missing. Not uploaded in patient's chart, not with medical records.   Might have to re-start application process for Belsomra.   Follow up plan: Telephone follow up appointment with care management team member scheduled for: 5-7 days  Ruben Reason, PharmD Clinical Pharmacist [place] [phone]

## 2019-10-20 ENCOUNTER — Ambulatory Visit: Payer: Self-pay | Admitting: Pharmacist

## 2019-10-20 DIAGNOSIS — F419 Anxiety disorder, unspecified: Secondary | ICD-10-CM

## 2019-10-20 DIAGNOSIS — F329 Major depressive disorder, single episode, unspecified: Secondary | ICD-10-CM

## 2019-10-20 DIAGNOSIS — F32A Depression, unspecified: Secondary | ICD-10-CM

## 2019-10-20 NOTE — Chronic Care Management (AMB) (Signed)
  Chronic Care Management   Follow Up Note   10/20/2019 Name: Melanie White MRN: [id number] DOB: 1946-08-04  Subjective Melanie White is a 73 y.o. year old female who is a primary care patient of Paulene Floor. The CCM team was consulted for assistance with chronic disease management and care coordination needs.    Review of patient status, including review of consultants reports, relevant laboratory and other test results, and collaboration with appropriate care team members and the patient's provider was performed as part of comprehensive patient evaluation and provision of chronic care management services.  .  Outpatient Encounter Medications as of 10/20/2019  Medication Sig Note  . amLODipine (NORVASC) 10 MG tablet Take 1 tablet (10 mg total) by mouth daily.   Marland Kitchen doxepin (SINEQUAN) 10 MG/ML solution Take 0.3 mLs (3 mg total) by mouth at bedtime.   . metoprolol tartrate (LOPRESSOR) 50 MG tablet Take 1 tablet (50 mg total) by mouth 2 (two) times daily.   . pantoprazole (PROTONIX) 40 MG tablet Take one daily as needed for hearburn not relieved by pantoprazole. 06/13/2019: Every other day  . sertraline (ZOLOFT) 50 MG tablet Take 1 tablet (50 mg total) by mouth daily.   . simvastatin (ZOCOR) 40 MG tablet Take 1 tablet (40 mg total) by mouth at bedtime.   . [DISCONTINUED] ciprofloxacin (CIPRO) 250 MG tablet Take 1 tablet (250 mg total) by mouth 2 (two) times daily. 08/25/2019: Finish 08/26/19   No facility-administered encounter medications on file as of 10/20/2019.      Goals Addressed            This Visit's Progress   . New Medication counseling: Sertraline (pt-stated)       Current Barriers:  Marland Kitchen Knowledge deficit related to purpose and mechanism of medications taken for anxiety and depression  Pharmacist Clinical Goal(s): Over the 30 days, Melanie White will verbalize understanding of medication plan as it relates to her plan of care.   Interventions: . Patient educated  on purpose, proper use and potential adverse effects of sertraline o Cautioned patient that it can take up 4 weeks for full effect though she may see some symptom relief in about 2 weeks  o Updated 10/29: patient reports some mild nausea that she is attributing to sertraline (though she has had a history of GI issues including nausea); she is self treating with Pepto Bismol and ginger ale  Patient Self Care Activities:  . Take all medications as prescribed . Contact provider if diarrhea persists >10 days  Please see past updates related to this goal by clicking on the "Past Updates" button in the selected goal         Follow up Telephone follow up appointment with care management team member scheduled for: 2 week care coordination visit with PharmD   Ruben Reason, PharmD Clinical Pharmacist [place] [phone]

## 2019-10-27 DIAGNOSIS — R69 Illness, unspecified: Secondary | ICD-10-CM | POA: Diagnosis not present

## 2019-11-07 ENCOUNTER — Other Ambulatory Visit: Payer: Self-pay | Admitting: Physician Assistant

## 2019-11-07 ENCOUNTER — Ambulatory Visit: Payer: Self-pay | Admitting: Pharmacist

## 2019-11-07 DIAGNOSIS — I10 Essential (primary) hypertension: Secondary | ICD-10-CM

## 2019-11-07 DIAGNOSIS — G4700 Insomnia, unspecified: Secondary | ICD-10-CM

## 2019-11-07 DIAGNOSIS — F419 Anxiety disorder, unspecified: Secondary | ICD-10-CM

## 2019-11-07 NOTE — Chronic Care Management (AMB) (Signed)
  Chronic Care Management   Care Coordination Note  11/07/2019 Name: REIZY DUNLOW MRN: [id number] DOB: Aug 10, 1946   Care Coordination: prepared patient's application to [employer] assistance program for [employer]. Patient has provided 2019 financial documents.   Goals Addressed            This Visit's Progress   . I need to sleep (pt-stated)       Current Barriers:  . Was taking Xanax (0.5mg  BID) for many years, quit cold [place] . Drinks 2 [employer]gate per day . Current every day smoker . Poor sleep hygeine: TV in bedroom, no consistent bedtime  Pharmacist Clinical Goal(s):  Marland Kitchen Over the next 30 days, CCM pharmacist and patient will re-apply to [employer] assistance program for Belsomra   Interventions: . Re-apply to [place] assistance program   Patient Self Care Activities:  . Patient will switch to caffeine free [employer] at [place] documents needed for [employer]) medication assistance application  Please see past updates related to this goal by clicking on the "Past Updates" button in the selected goal         Plan: Recommendations for provider: complete prescriber portion of [employer] application and return to [employer]   Follow up:  [employer] application in the next 1-2 weeks for 2021 cycle  Ruben Reason, PharmD Clinical Pharmacist [place] [phone]

## 2019-11-07 NOTE — Patient Instructions (Signed)
Goals Addressed            This Visit's Progress   . I need to sleep (pt-stated)       Current Barriers:  . Was taking Xanax (0.5mg  BID) for many years, quit cold [place] . Drinks 2 [employer]gate per day . Current every day smoker . Poor sleep hygeine: TV in bedroom, no consistent bedtime  Pharmacist Clinical Goal(s):  Marland Kitchen Over the next 30 days, CCM pharmacist and patient will re-apply to [employer] assistance program for Belsomra   Interventions: . Re-apply to [place] assistance program   Patient Self Care Activities:  . Patient will switch to caffeine free [employer] at [place] documents needed for [employer]) medication assistance application  Please see past updates related to this goal by clicking on the "Past Updates" button in the selected goal

## 2019-11-08 DIAGNOSIS — R69 Illness, unspecified: Secondary | ICD-10-CM | POA: Diagnosis not present

## 2019-11-14 ENCOUNTER — Ambulatory Visit: Payer: Self-pay | Admitting: Pharmacist

## 2019-11-14 DIAGNOSIS — F419 Anxiety disorder, unspecified: Secondary | ICD-10-CM

## 2019-11-14 DIAGNOSIS — G4700 Insomnia, unspecified: Secondary | ICD-10-CM

## 2019-11-14 NOTE — Patient Instructions (Signed)
Goals Addressed            This Visit's Progress   . 2021 Medication assistance (pt-stated)       Current Barriers:  . financial  Pharmacist Clinical Goal(s): Over the next 14 days, Ms.Milus Mallick will provide the necessary supplementary documents (proof of out of pocket prescription expenditure, proof of household income) needed for medication assistance applications to CCM pharmacist.   Interventions: . CCM pharmacist will apply for medication assistance program for Belsomra made by [employer]ck and prescribed by Carles Collet   Patient Self Care Activities:  Marland Kitchen Gather necessary documents needed to apply for medication assistance  Initial goal documentation

## 2019-11-14 NOTE — Chronic Care Management (AMB) (Signed)
  Chronic Care Management   Care Coordination Note  11/14/2019 Name: HEDDY VIDANA MRN: [id number] DOB: 1946/07/07  Care Coordination: received all parts of [employer] assistance application for [employer]. Submitted application via mail, uploaded copy for our records to patient's chart (under media tab)  Goals Addressed            This Visit's Progress   . 2021 Medication assistance (pt-stated)       Current Barriers:  . financial  Pharmacist Clinical Goal(s): Over the next 14 days, Ms.Milus Mallick will provide the necessary supplementary documents (proof of out of pocket prescription expenditure, proof of household income) needed for medication assistance applications to CCM pharmacist.   Interventions: . CCM pharmacist will apply for medication assistance program for Belsomra made by [employer]ck and prescribed by Carles Collet   Patient Self Care Activities:  Marland Kitchen Gather necessary documents needed to apply for medication assistance  Initial goal documentation         Follow up plan: Telephone follow up appointment with care management team member scheduled for:4 weeks  Ruben Reason, PharmD Clinical Pharmacist [place] [phone]

## 2019-11-18 ENCOUNTER — Other Ambulatory Visit: Payer: Self-pay | Admitting: Physician Assistant

## 2019-11-18 DIAGNOSIS — F419 Anxiety disorder, unspecified: Secondary | ICD-10-CM

## 2019-11-28 DIAGNOSIS — R69 Illness, unspecified: Secondary | ICD-10-CM | POA: Diagnosis not present

## 2020-02-07 ENCOUNTER — Other Ambulatory Visit: Payer: Self-pay | Admitting: Physician Assistant

## 2020-02-07 ENCOUNTER — Telehealth: Payer: Self-pay | Admitting: Physician Assistant

## 2020-02-07 DIAGNOSIS — Z8719 Personal history of other diseases of the digestive system: Secondary | ICD-10-CM

## 2020-02-07 DIAGNOSIS — I10 Essential (primary) hypertension: Secondary | ICD-10-CM

## 2020-02-07 MED ORDER — PANTOPRAZOLE SODIUM 40 MG PO TBEC: DELAYED_RELEASE_TABLET | ORAL | 1 refills | Status: DC

## 2020-02-07 NOTE — Telephone Encounter (Signed)
Copied from CRM [id number]. Topic: General - Other >> Feb 07, 2020 11:42 AM Tamela Oddi wrote: Reason for CRM: Called to get clarification on a script for pantoprazole (PROTONIX) 40 MG tablet.  Please call to discuss at [phone]

## 2020-02-07 NOTE — Telephone Encounter (Signed)
Returned call to pharmacy.

## 2020-02-07 NOTE — Telephone Encounter (Signed)
[facility] faxed refill request for the following medications:   pantoprazole (PROTONIX) 40 MG tablet   Please advise. Thanks, TGH 

## 2020-02-16 ENCOUNTER — Other Ambulatory Visit: Payer: Self-pay | Admitting: Physician Assistant

## 2020-02-16 DIAGNOSIS — F419 Anxiety disorder, unspecified: Secondary | ICD-10-CM

## 2020-03-31 ENCOUNTER — Ambulatory Visit: Payer: Medicare HMO | Attending: Internal Medicine

## 2020-03-31 DIAGNOSIS — Z23 Encounter for immunization: Secondary | ICD-10-CM

## 2020-03-31 NOTE — Progress Notes (Signed)
   Covid-19 Vaccination Clinic  Name:  ENNIFER HARSTON    MRN: [id number] DOB: 1946-01-17  03/31/2020  Ms. Vandehei was observed post Covid-19 immunization for 15 minutes without incident. She was provided with Vaccine Information Sheet and instruction to access the V-Safe system.   Ms. Desantiago was instructed to call 911 with any severe reactions post vaccine: Marland Kitchen Difficulty breathing  . Swelling of face and throat  . A fast heartbeat  . A bad rash all over body  . Dizziness and weakness   Immunizations Administered    Name Date Dose VIS Date Route   Pfizer COVID-19 Vaccine 03/31/2020  1:41 PM 0.3 mL 12/02/2019 Intramuscular   Manufacturer: [employer], [employer]   Lot: [id number]   NDC: [address]-9

## 2020-04-02 ENCOUNTER — Encounter: Payer: Medicare HMO | Admitting: Physician Assistant

## 2020-04-02 NOTE — Progress Notes (Deleted)
Patient: Melanie White, Female    DOB: [birth year], 74 y.o.   MRN: [id number] Visit Date: 04/02/2020  Today's Provider: Trey Sailors, PA-C   No chief complaint on file.  Subjective:     Complete Physical Melanie White is a 74 y.o. female. She feels {DESC; WELL/FAIRLY WELL/POORLY:18703}. She reports exercising ***. She reports she is sleeping {DESC; WELL/FAIRLY WELL/POORLY:18703}.  -----------------------------------------------------------   Review of Systems  Constitutional: Negative.   HENT: Negative.   Eyes: Negative.   Respiratory: Negative.   Cardiovascular: Negative.   Gastrointestinal: Negative.   Endocrine: Negative.   Genitourinary: Negative.   Musculoskeletal: Negative.   Skin: Negative.   Allergic/Immunologic: Negative.   Neurological: Negative.   Hematological: Negative.   Psychiatric/Behavioral: Negative.     Social History   Socioeconomic History  . Marital status: Divorced    Spouse name: Not on file  . Number of children: 1  . Years of education: Not on file  . Highest education level: Not on file  Occupational History  . Occupation: Retired  Tobacco Use  . Smoking status: Current Every Day Smoker    Types: Cigarettes  . Smokeless tobacco: Never Used  Substance and Sexual Activity  . Alcohol use: No  . Drug use: No  . Sexual activity: Not on file  Other Topics Concern  . Not on file  Social History Narrative  . Not on file   Social Determinants of Health   Financial Resource Strain:   . Difficulty of Paying Living Expenses:   Food Insecurity:   . Worried About Programme researcher, broadcasting/film/video in the Last Year:   . Barista in the Last Year:   Transportation Needs:   . Freight forwarder (Medical):   Marland Kitchen Lack of Transportation (Non-Medical):   Physical Activity:   . Days of Exercise per Week:   . Minutes of Exercise per Session:   Stress:   . Feeling of Stress :   Social Connections:   . Frequency of Communication  with Friends and Family:   . Frequency of Social Gatherings with Friends and Family:   . Attends Religious Services:   . Active Member of Clubs or Organizations:   . Attends Banker Meetings:   Marland Kitchen Marital Status:   Intimate Partner Violence:   . Fear of Current or Ex-Partner:   . Emotionally Abused:   Marland Kitchen Physically Abused:   . Sexually Abused:     Past Medical History:  Diagnosis Date  . Anxiety   . Depression   . Hyperlipidemia   . Hypertension      Patient Active Problem List   Diagnosis Date Noted  . Tobacco abuse 12/01/2015  . Allergic rhinitis 11/30/2015  . Anxiety 11/30/2015  . Narrowing of intervertebral disc space 11/30/2015  . History of duodenal ulcer 11/30/2015  . BP (high blood pressure) 11/30/2015  . Osteopenia 11/30/2015  . Lumbar canal stenosis 11/30/2015  . Scoliosis 11/30/2015  . Insomnia 11/30/2015  . Chronic back pain 06/04/2015  . HLD (hyperlipidemia) 04/20/2006    Past Surgical History:  Procedure Laterality Date  . ABDOMINAL HYSTERECTOMY     Partial Hysterectomy  . TONSILLECTOMY      Her family history includes Anxiety disorder in her maternal aunt and mother; Epilepsy in her sister; Heart attack in her father; Hypertension in her mother and sister.   Current Outpatient Medications:  .  amLODipine (NORVASC) 10 MG tablet, Take 1 tablet by  mouth once daily, Disp: 90 tablet, Rfl: 0 .  doxepin (SINEQUAN) 10 MG/ML solution, Take 0.3 mLs (3 mg total) by mouth at bedtime., Disp: 120 mL, Rfl: 12 .  metoprolol tartrate (LOPRESSOR) 50 MG tablet, Take 1 tablet by mouth twice daily, Disp: 180 tablet, Rfl: 0 .  pantoprazole (PROTONIX) 40 MG tablet, Take one daily as needed for hearburn not relieved by pantoprazole., Disp: 90 tablet, Rfl: 1 .  sertraline (ZOLOFT) 50 MG tablet, Take 1 tablet by mouth once daily, Disp: 90 tablet, Rfl: 0 .  simvastatin (ZOCOR) 40 MG tablet, Take 1 tablet (40 mg total) by mouth at bedtime., Disp: 90 tablet, Rfl:  3  Patient Care Team: Paulene Floor as PCP - General (Physician Assistant) Cathi Roan, [facility] (Pharmacist)     Objective:    Vitals: There were no vitals taken for this visit.  Physical Exam  Activities of Daily Living No flowsheet data found.  Fall Risk Assessment Fall Risk  12/20/2018 02/16/2017  Falls in the past year? 0 No     Depression Screen PHQ 2/9 Scores 12/20/2018 02/16/2017  PHQ - 2 Score 2 2  PHQ- 9 Score - 5    No flowsheet data found.     Assessment & Plan:    Annual Physical Reviewed patient's Family Medical History Reviewed and updated list of patient's medical providers Assessment of cognitive impairment was done Assessed patient's functional ability Established a written schedule for health screening [employer] Completed and Reviewed  Exercise Activities and Dietary recommendations Goals    . 2021 Medication assistance (pt-stated)     Current Barriers:  . financial  Pharmacist Clinical Goal(s): Over the next 14 days, Ms.Melanie White will provide the necessary supplementary documents (proof of out of pocket prescription expenditure, proof of household income) needed for medication assistance applications to CCM pharmacist.   Interventions: . CCM pharmacist will apply for medication assistance program for Belsomra made by [employer] and prescribed by Carles Collet   Patient Self Care Activities:  Marland Kitchen Gather necessary documents needed to apply for medication assistance  Initial goal documentation     . I need to sleep (pt-stated)     Current Barriers:  . Was taking Xanax (0.5mg  BID) for many years, quit cold [place] . Drinks 2 [employer] per day . Current every day smoker . Poor sleep hygeine: TV in bedroom, no consistent bedtime  Pharmacist Clinical Goal(s):  Marland Kitchen Over the next 30 days, CCM pharmacist and patient will re-apply to [employer] assistance program for Belsomra   Interventions: . Re-apply to [place]  assistance program   Patient Self Care Activities:  . Patient will switch to caffeine free [employer] at [place] documents needed for [employer]) medication assistance application  Please see past updates related to this goal by clicking on the "Past Updates" button in the selected goal      . New Medication counseling: Sertraline (pt-stated)     Current Barriers:  Marland Kitchen Knowledge deficit related to purpose and mechanism of medications taken for anxiety and depression  Pharmacist Clinical Goal(s): Over the 30 days, Ms.Melanie White will verbalize understanding of medication plan as it relates to her plan of care.   Interventions: . Patient educated on purpose, proper use and potential adverse effects of sertraline o Cautioned patient that it can take up 4 weeks for full effect though she may see some symptom relief in about 2 weeks  o Updated 10/29: patient reports some mild  nausea that she is attributing to sertraline (though she has had a history of GI issues including nausea); she is self treating with Pepto Bismol and ginger ale  Patient Self Care Activities:  . Take all medications as prescribed . Contact provider if diarrhea persists >10 days  Please see past updates related to this goal by clicking on the "Past Updates" button in the selected goal         Immunization History  Administered Date(s) Administered  . PFIZER SARS-COV-2 Vaccination 03/31/2020    Health Maintenance  Topic Date Due  . Hepatitis C Screening  Never done  . TETANUS/TDAP  Never done  . DEXA SCAN  Never done  . PNA vac Low Risk Adult (1 of 2 - PCV13) Never done  . COLONOSCOPY  01/29/2015  . INFLUENZA VACCINE  07/22/2020     Discussed health benefits of physical activity, and encouraged her to engage in regular exercise appropriate for her age and condition.    ------------------------------------------------------------------------------------------------------------    Trey Sailors, PA-C  [facility]

## 2020-04-10 NOTE — Progress Notes (Signed)
Annual Wellness Visit     Patient: Melanie White, Female    DOB: Aug 30, 1946, 75 y.o.   MRN: [id number] Visit Date: 04/17/2020  Today's Provider: Trey Sailors, PA-C  Subjective:    Melanie White,acting as a scribe for Trey Sailors, PA-C.,have documented all relevant documentation on the behalf of Trey Sailors, PA-C,as directed by  Trey Sailors, PA-C while in the presence of Trey Sailors, PA-C.  Chief Complaint  Patient presents with  . Annual Exam   Melanie White is a 74 y.o. female who presents today for her Annual Wellness Visit. She reports consuming a general diet. The patient does not participate in regular exercise at present. She generally feels poorly. She reports sleeping fairly well. She does not have additional problems to discuss today.   HPI  Last Colonoscopy was 01/29/2005. Due now.  Last Mammogram was 01/21/2006 with an ultrasound done at this time as well.  She declines routine screening examinations.   BP Readings from Last 3 Encounters:  04/16/20 (!) 158/96  08/23/19 126/77  07/04/19 93/67   Wt Readings from Last 3 Encounters:  04/16/20 120 lb 12.8 oz (54.8 kg)  08/23/19 112 lb (50.8 kg)  07/04/19 111 lb 12.8 oz (50.7 kg)   Hypertension, follow-up  BP Readings from Last 3 Encounters:  04/16/20 (!) 158/96  08/23/19 126/77  07/04/19 93/67   Wt Readings from Last 3 Encounters:  04/16/20 120 lb 12.8 oz (54.8 kg)  08/23/19 112 lb (50.8 kg)  07/04/19 111 lb 12.8 oz (50.7 kg)     She was last seen for hypertension 6 months ago.  BP at that visit was normal . Management since that visit includes continue current medications.  She reports good compliance with treatment. She is not having side effects.  She is following a Regular diet. She is not exercising. She does smoke.  Use of agents associated with hypertension: none.   Outside blood pressures are not checked.  Symptoms:  YES NO    []    [x]    Chest Pain   []     [x]    Chest pressure/discomfort   []    [x]    Palpitations   []    [x]    Dyspnea   []    [x]    Orthopnea   []    [x]    Paroxysmal nocturnal dyspnea   []   [x]    Lower extremity edema   []    [x]   Syncope   Pertinent labs: Lab Results  Component Value Date   CHOL 132 04/16/2020   HDL 53 04/16/2020   LDLCALC 52 04/16/2020   TRIG 162 (H) 04/16/2020   CHOLHDL 2.5 04/16/2020   Lab Results  Component Value Date   NA 143 04/16/2020   K 4.5 04/16/2020   CO2 23 04/16/2020   GLUCOSE 97 04/16/2020   BUN 27 04/16/2020   CREATININE 0.85 04/16/2020   CALCIUM 9.7 04/16/2020   GFRNONAA 67 04/16/2020   GFRAA 78 04/16/2020     The 10-year ASCVD risk score DC Jr., et al., 2013) is: 40%   --------------------------------------------------------------------------------------------------- Lipid/Cholesterol, Follow-up  Last lipid panel Other pertinent labs  Lab Results  Component Value Date   CHOL 132 04/16/2020   HDL 53 04/16/2020   LDLCALC 52 04/16/2020   TRIG 162 (H) 04/16/2020   CHOLHDL 2.5 04/16/2020   Lab Results  Component Value Date   ALT 9 04/16/2020   AST 19 04/16/2020   PLT 282 04/16/2020  TSH 1.390 11/30/2015     She was last seen for this 6 months ago.  Management since that visit includes continue simvastatin 40 mg QHS.  She reports good compliance with treatment. She is not having side effects.  Symptoms: No chest pain No chest pressure/discomfort No dyspnea No lower extremity edema No numbness or tingling of extremity No orthopnea No palpitations No paroxysmal nocturnal dyspnea No speech difficulty No syncope  Current diet: in general, an "unhealthy" diet Current exercise: none  Wt Readings from Last 3 Encounters:  04/16/20 120 lb 12.8 oz (54.8 kg)  08/23/19 112 lb (50.8 kg)  07/04/19 111 lb 12.8 oz (50.7 kg)   The 10-year ASCVD risk score Denman George DC Jr., et al., 2013) is: 40%  Continue with low back pain. This is a chronic issue for her. She  reports having been seen by orthopedists in the past for this and receiving steroid injections and nerve blocks without relief.   Continues on zoloft 50 mg daily for anxiety.   Still trying to control insomnia. Hasn't gotten assistance for belsomra.  -----------------------------------------------------------------------------------------       Medications: Outpatient Medications Prior to Visit  Medication Sig  . doxepin (SINEQUAN) 10 MG/ML solution Take 0.3 mLs (3 mg total) by mouth at bedtime. (Patient not taking: Reported on 04/16/2020)  . [DISCONTINUED] amLODipine (NORVASC) 10 MG tablet Take 1 tablet by mouth once daily  . [DISCONTINUED] metoprolol tartrate (LOPRESSOR) 50 MG tablet Take 1 tablet by mouth twice daily  . [DISCONTINUED] pantoprazole (PROTONIX) 40 MG tablet Take one daily as needed for hearburn not relieved by pantoprazole.  . [DISCONTINUED] sertraline (ZOLOFT) 50 MG tablet Take 1 tablet by mouth once daily  . [DISCONTINUED] simvastatin (ZOCOR) 40 MG tablet Take 1 tablet (40 mg total) by mouth at bedtime.   No facility-administered medications prior to visit.    Allergies  Allergen Reactions  . Hydrochlorothiazide Diarrhea    Patient Care Team: Maryella Shivers as PCP - General (Physician Assistant) Andee Poles, [facility] (Pharmacist)  Review of Systems  Constitutional: Negative.   HENT: Negative.   Eyes: Positive for photophobia and itching.  Respiratory: Negative.   Cardiovascular: Negative.   Gastrointestinal: Positive for abdominal distention and nausea.  Endocrine: Positive for polyphagia and polyuria.  Genitourinary: Negative.   Musculoskeletal: Positive for back pain.  Skin: Negative.   Allergic/Immunologic: Positive for environmental allergies.  Neurological: Positive for headaches.  Hematological: Negative.   Psychiatric/Behavioral: Negative.        Objective:    Vitals: BP (!) 158/96 (BP Location: Left Arm, Patient Position:  Sitting, Cuff Size: Normal)   Pulse 66   Temp (!) 97.5 F (36.4 C) (Temporal)   Ht 5\' 6"  (1.676 m)   Wt 120 lb 12.8 oz (54.8 kg)   SpO2 96%   BMI 19.50 kg/m    Physical Exam Constitutional:      Appearance: Normal appearance.  Cardiovascular:     Rate and Rhythm: Normal rate and regular rhythm.     Heart sounds: Normal heart sounds.  Pulmonary:     Effort: Pulmonary effort is normal.     Breath sounds: Normal breath sounds.  Skin:    General: Skin is warm and dry.  Neurological:     Mental Status: She is alert and oriented to person, place, and time. Mental status is at baseline.  Psychiatric:        Mood and Affect: Mood normal.      Most recent functional status  assessment: In your present state of health, do you have any difficulty performing the following activities: 04/16/2020  Hearing? N  Vision? N  Difficulty concentrating or making decisions? N  Walking or climbing stairs? Y  Comment sometimes per pt  Dressing or bathing? Y  Comment sometimes per pt  Doing errands, shopping? Y  Comment sometimes per pt  Some recent data might be hidden    Most recent fall risk assessment: Fall Risk  04/16/2020  Falls in the past year? 0  Number falls in past yr: 0  Injury with Fall? 0     Most recent depression screenings: PHQ 2/9 Scores 04/16/2020 12/20/2018  PHQ - 2 Score 1 2  PHQ- 9 Score 6 -    Most recent cognitive screening: No flowsheet data found.  Results for orders placed or performed in visit on 04/16/20  Comprehensive Metabolic Panel (CMET)  Result Value Ref Range   Glucose 97 65 - 99 mg/dL   BUN 27 8 - 27 mg/dL   Creatinine, Ser 1.610.85 0.57 - 1.00 mg/dL   GFR calc non Af Amer 67 >59 mL/min/1.73   GFR calc Af Amer 78 >59 mL/min/1.73   BUN/Creatinine Ratio 32 (H) 12 - 28   Sodium 143 134 - 144 mmol/L   Potassium 4.5 3.5 - 5.2 mmol/L   Chloride 104 96 - 106 mmol/L   CO2 23 20 - 29 mmol/L   Calcium 9.7 8.7 - 10.3 mg/dL   Total Protein 6.7 6.0 - 8.5  g/dL   Albumin 4.2 3.7 - 4.7 g/dL   Globulin, Total 2.5 1.5 - 4.5 g/dL   Albumin/Globulin Ratio 1.7 1.2 - 2.2   Bilirubin Total 0.3 0.0 - 1.2 mg/dL   Alkaline Phosphatase 84 39 - 117 IU/L   AST 19 0 - 40 IU/L   ALT 9 0 - 32 IU/L  CBC with Differential  Result Value Ref Range   WBC 13.3 (H) 3.4 - 10.8 x10E3/uL   RBC 4.29 3.77 - 5.28 x10E6/uL   Hemoglobin 14.0 11.1 - 15.9 g/dL   Hematocrit 09.641.5 04.534.0 - 46.6 %   MCV 97 79 - 97 fL   MCH 32.6 26.6 - 33.0 pg   MCHC 33.7 31.5 - 35.7 g/dL   RDW 40.914.3 81.111.7 - 91.415.4 %   Platelets 282 150 - 450 x10E3/uL   Neutrophils 77 Not Estab. %   Lymphs 16 Not Estab. %   Monocytes 5 Not Estab. %   Eos 1 Not Estab. %   Basos 1 Not Estab. %   Neutrophils Absolute 10.2 (H) 1.4 - 7.0 x10E3/uL   Lymphocytes Absolute 2.1 0.7 - 3.1 x10E3/uL   Monocytes Absolute 0.7 0.1 - 0.9 x10E3/uL   EOS (ABSOLUTE) 0.1 0.0 - 0.4 x10E3/uL   Basophils Absolute 0.1 0.0 - 0.2 x10E3/uL   Immature Granulocytes 0 Not Estab. %   Immature Grans (Abs) 0.0 0.0 - 0.1 x10E3/uL  Lipid Profile  Result Value Ref Range   Cholesterol, Total 132 100 - 199 mg/dL   Triglycerides 782162 (H) 0 - 149 mg/dL   HDL 53 >95>39 mg/dL   VLDL Cholesterol Cal 27 5 - 40 mg/dL   LDL Chol Calc (NIH) 52 0 - 99 mg/dL   Chol/HDL Ratio 2.5 0.0 - 4.4 ratio      Assessment & Plan:      Annual wellness visit done today including the all of the following: Reviewed patient's Family Medical History Reviewed and updated list of patient's medical providers Assessment  of cognitive impairment was done Assessed patient's functional ability Established a written schedule for health screening services Health Risk Assessent Completed and Reviewed  Exercise Activities and Dietary recommendations Goals      Patient Stated   . 2021 Medication assistance (pt-stated)     Current Barriers:  . financial  Pharmacist Clinical Goal(s): Over the next 14 days, Ms.Rudy Jew will provide the necessary supplementary documents  (proof of out of pocket prescription expenditure, proof of household income) needed for medication assistance applications to CCM pharmacist.   Interventions: . CCM pharmacist will apply for medication assistance program for Belsomra made by [employer] and prescribed by Osvaldo Angst   Patient Self Care Activities:  Marland Kitchen Gather necessary documents needed to apply for medication assistance  Initial goal documentation     . I need to sleep (pt-stated)     Current Barriers:  . Was taking Xanax (0.5mg  BID) for many years, quit cold [place] . Drinks 2 [employer] per day . Current every day smoker . Poor sleep hygeine: TV in bedroom, no consistent bedtime  Pharmacist Clinical Goal(s):  Marland Kitchen Over the next 30 days, CCM pharmacist and patient will re-apply to [employer] assistance program for Belsomra   Interventions: . Re-apply to Belsomra assistance program   Patient Self Care Activities:  . Patient will switch to caffeine free [employer] at [employer] . Arlan Organ documents needed for [employer]) medication assistance application  Please see past updates related to this goal by clicking on the "Past Updates" button in the selected goal      . New Medication counseling: Sertraline (pt-stated)     Current Barriers:  Marland Kitchen Knowledge deficit related to purpose and mechanism of medications taken for anxiety and depression  Pharmacist Clinical Goal(s): Over the 30 days, Ms.Rudy Jew will verbalize understanding of medication plan as it relates to her plan of care.   Interventions: . Patient educated on purpose, proper use and potential adverse effects of sertraline o Cautioned patient that it can take up 4 weeks for full effect though she may see some symptom relief in about 2 weeks  o Updated 10/29: patient reports some mild nausea that she is attributing to sertraline (though she has had a history of GI issues including nausea); she is self treating with Pepto Bismol and ginger ale  Patient Self Care  Activities:  . Take all medications as prescribed . Contact provider if diarrhea persists >10 days  Please see past updates related to this goal by clicking on the "Past Updates" button in the selected goal         Immunization History  Administered Date(s) Administered  . Influenza, High Dose Seasonal PF 11/08/2019  . PFIZER SARS-COV-2 Vaccination 03/31/2020    Health Maintenance  Topic Date Due  . Hepatitis C Screening  Never done  . TETANUS/TDAP  Never done  . COVID-19 Vaccine (2 - Pfizer 2-dose series) 04/21/2020  . DEXA SCAN  04/16/2021 (Originally 02/16/2010)  . COLONOSCOPY  04/16/2021 (Originally 01/29/2015)  . PNA vac Low Risk Adult (1 of 2 - PCV13) 04/16/2021 (Originally 02/16/2010)  . INFLUENZA VACCINE  07/22/2020     Discussed health benefits of physical activity, and encouraged her to engage in regular exercise appropriate for her age and condition.     1. Annual physical exam  Declines screening exams which is her usually.   2. Mixed hyperlipidemia  Labs as below, adjust pending labwork.  - Comprehensive Metabolic Panel (CMET) - CBC with Differential - Lipid Profile -  simvastatin (ZOCOR) 40 MG tablet; Take 1 tablet (40 mg total) by mouth at bedtime.  Dispense: 90 tablet; Refill: 3  3. Essential hypertension  Elevated today but typically normal. F/u in 6 months, recheck then. Continue current medications.  - Comprehensive Metabolic Panel (CMET) - CBC with Differential - Lipid Profile - amLODipine (NORVASC) 10 MG tablet; Take 1 tablet (10 mg total) by mouth daily.  Dispense: 90 tablet; Refill: 1 - metoprolol tartrate (LOPRESSOR) 50 MG tablet; Take 1 tablet (50 mg total) by mouth 2 (two) times daily.  Dispense: 180 tablet; Refill: 1  4. Insomnia, unspecified type  Can use doxepin.  5. Anxiety  - sertraline (ZOLOFT) 50 MG tablet; Take 1 tablet (50 mg total) by mouth daily.  Dispense: 90 tablet; Refill: 1  6. Hx of gastritis  - pantoprazole (PROTONIX)  40 MG tablet; Take one daily as needed for hearburn not relieved by pantoprazole.  Dispense: 90 tablet; Refill: 1   Return in about 6 months (around 10/16/2020) for HTN.     ITrinna Post, PA-C, have reviewed all documentation for this visit. The documentation on 04/17/20 for the exam, diagnosis, procedures, and orders are all accurate and complete.  The entirety of the information documented in the History of Present Illness, Review of Systems and Physical Exam were personally obtained by me. Portions of this information were initially documented by [facility] and reviewed by me for thoroughness and accuracy.     Paulene Floor  [facility] [phone] (phone) [phone] (fax)  [place]

## 2020-04-16 ENCOUNTER — Encounter: Payer: Self-pay | Admitting: Physician Assistant

## 2020-04-16 ENCOUNTER — Other Ambulatory Visit: Payer: Self-pay

## 2020-04-16 ENCOUNTER — Ambulatory Visit (INDEPENDENT_AMBULATORY_CARE_PROVIDER_SITE_OTHER): Payer: Medicare HMO | Admitting: Physician Assistant

## 2020-04-16 VITALS — BP 158/96 | HR 66 | Temp 97.5°F | Ht 66.0 in | Wt 120.8 lb

## 2020-04-16 DIAGNOSIS — I10 Essential (primary) hypertension: Secondary | ICD-10-CM | POA: Diagnosis not present

## 2020-04-16 DIAGNOSIS — G4700 Insomnia, unspecified: Secondary | ICD-10-CM | POA: Diagnosis not present

## 2020-04-16 DIAGNOSIS — F419 Anxiety disorder, unspecified: Secondary | ICD-10-CM | POA: Diagnosis not present

## 2020-04-16 DIAGNOSIS — R69 Illness, unspecified: Secondary | ICD-10-CM | POA: Diagnosis not present

## 2020-04-16 DIAGNOSIS — Z8719 Personal history of other diseases of the digestive system: Secondary | ICD-10-CM

## 2020-04-16 DIAGNOSIS — E782 Mixed hyperlipidemia: Secondary | ICD-10-CM

## 2020-04-16 DIAGNOSIS — Z0000 Encounter for general adult medical examination without abnormal findings: Secondary | ICD-10-CM | POA: Diagnosis not present

## 2020-04-16 NOTE — Patient Instructions (Signed)
Health Maintenance, Female Adopting a healthy lifestyle and getting preventive care are important in promoting health and wellness. Ask your health care provider about:  The right schedule for you to have regular tests and exams.  Things you can do on your own to prevent diseases and keep yourself healthy. What should I know about diet, weight, and exercise? Eat a healthy diet   Eat a diet that includes plenty of vegetables, fruits, low-fat dairy products, and lean protein.  Do not eat a lot of foods that are high in solid fats, added sugars, or sodium. Maintain a healthy weight Body mass index (BMI) is used to identify weight problems. It estimates body fat based on height and weight. Your health care provider can help determine your BMI and help you achieve or maintain a healthy weight. Get regular exercise Get regular exercise. This is one of the most important things you can do for your health. Most adults should:  Exercise for at least 150 minutes each week. The exercise should increase your heart rate and make you sweat (moderate-intensity exercise).  Do strengthening exercises at least twice a week. This is in addition to the moderate-intensity exercise.  Spend less time sitting. Even light physical activity can be beneficial. Watch cholesterol and blood lipids Have your blood tested for lipids and cholesterol at 75 years of age, then have this test every 5 years. Have your cholesterol levels checked more often if:  Your lipid or cholesterol levels are high.  You are older than 75 years of age.  You are at high risk for heart disease. What should I know about cancer screening? Depending on your health history and family history, you may need to have cancer screening at various ages. This may include screening for:  Breast cancer.  Cervical cancer.  Colorectal cancer.  Skin cancer.  Lung cancer. What should I know about heart disease, diabetes, and high blood  pressure? Blood pressure and heart disease  High blood pressure causes heart disease and increases the risk of stroke. This is more likely to develop in people who have high blood pressure readings, are of African descent, or are overweight.  Have your blood pressure checked: ? Every 3-5 years if you are 18-39 years of age. ? Every year if you are 40 years old or older. Diabetes Have regular diabetes screenings. This checks your fasting blood sugar level. Have the screening done:  Once every three years after age 40 if you are at a normal weight and have a low risk for diabetes.  More often and at a younger age if you are overweight or have a high risk for diabetes. What should I know about preventing infection? Hepatitis B If you have a higher risk for hepatitis B, you should be screened for this virus. Talk with your health care provider to find out if you are at risk for hepatitis B infection. Hepatitis C Testing is recommended for:  Everyone born from 1945 through 1965.  Anyone with known risk factors for hepatitis C. Sexually transmitted infections (STIs)  Get screened for STIs, including gonorrhea and chlamydia, if: ? You are sexually active and are younger than 75 years of age. ? You are older than 75 years of age and your health care provider tells you that you are at risk for this type of infection. ? Your sexual activity has changed since you were last screened, and you are at increased risk for chlamydia or gonorrhea. Ask your health care provider if   you are at risk.  Ask your health care provider about whether you are at high risk for HIV. Your health care provider may recommend a prescription medicine to help prevent HIV infection. If you choose to take medicine to prevent HIV, you should first get tested for HIV. You should then be tested every 3 months for as long as you are taking the medicine. Pregnancy  If you are about to stop having your period (premenopausal) and  you may become pregnant, seek counseling before you get pregnant.  Take 400 to 800 micrograms (mcg) of folic acid every day if you become pregnant.  Ask for birth control (contraception) if you want to prevent pregnancy. Osteoporosis and menopause Osteoporosis is a disease in which the bones lose minerals and strength with aging. This can result in bone fractures. If you are 65 years old or older, or if you are at risk for osteoporosis and fractures, ask your health care provider if you should:  Be screened for bone loss.  Take a calcium or vitamin D supplement to lower your risk of fractures.  Be given hormone replacement therapy (HRT) to treat symptoms of menopause. Follow these instructions at home: Lifestyle  Do not use any products that contain nicotine or tobacco, such as cigarettes, e-cigarettes, and chewing tobacco. If you need help quitting, ask your health care provider.  Do not use street drugs.  Do not share needles.  Ask your health care provider for help if you need support or information about quitting drugs. Alcohol use  Do not drink alcohol if: ? Your health care provider tells you not to drink. ? You are pregnant, may be pregnant, or are planning to become pregnant.  If you drink alcohol: ? Limit how much you use to 0-1 drink a day. ? Limit intake if you are breastfeeding.  Be aware of how much alcohol is in your drink. In the [place] one drink equals one 12 oz bottle of beer (355 mL), one 5 oz glass of wine (148 mL), or one 1 oz glass of hard liquor (44 mL). General instructions  Schedule regular health, dental, and eye exams.  Stay current with your vaccines.  Tell your health care provider if: ? You often feel depressed. ? You have ever been abused or do not feel safe at home. Summary  Adopting a healthy lifestyle and getting preventive care are important in promoting health and wellness.  Follow your health care provider's instructions about healthy  diet, exercising, and getting tested or screened for diseases.  Follow your health care provider's instructions on monitoring your cholesterol and blood pressure. This information is not intended to replace advice given to you by your health care provider. Make sure you discuss any questions you have with your health care provider. Document Revised: 12/01/2018 Document Reviewed: 12/01/2018 Elsevier Patient Education  2020 Elsevier Inc.  

## 2020-04-17 LAB — COMPREHENSIVE METABOLIC PANEL
ALT: 9 IU/L (ref 0–32)
AST: 19 IU/L (ref 0–40)
Albumin/Globulin Ratio: 1.7 (ref 1.2–2.2)
Albumin: 4.2 g/dL (ref 3.7–4.7)
Alkaline Phosphatase: 84 IU/L (ref 39–117)
BUN/Creatinine Ratio: 32 — ABNORMAL HIGH (ref 12–28)
BUN: 27 mg/dL (ref 8–27)
Bilirubin Total: 0.3 mg/dL (ref 0.0–1.2)
CO2: 23 mmol/L (ref 20–29)
Calcium: 9.7 mg/dL (ref 8.7–10.3)
Chloride: 104 mmol/L (ref 96–106)
Creatinine, Ser: 0.85 mg/dL (ref 0.57–1.00)
GFR calc Af Amer: 78 mL/min/{1.73_m2} (ref >59)
GFR calc non Af Amer: 67 mL/min/{1.73_m2} (ref >59)
Globulin, Total: 2.5 g/dL (ref 1.5–4.5)
Glucose: 97 mg/dL (ref 65–99)
Potassium: 4.5 mmol/L (ref 3.5–5.2)
Sodium: 143 mmol/L (ref 134–144)
Total Protein: 6.7 g/dL (ref 6.0–8.5)

## 2020-04-17 LAB — CBC WITH DIFFERENTIAL/PLATELET
Basophils Absolute: 0.1 10*3/uL (ref 0.0–0.2)
Basos: 1 %
EOS (ABSOLUTE): 0.1 10*3/uL (ref 0.0–0.4)
Eos: 1 %
Hematocrit: 41.5 % (ref 34.0–46.6)
Hemoglobin: 14 g/dL (ref 11.1–15.9)
Immature Grans (Abs): 0 10*3/uL (ref 0.0–0.1)
Immature Granulocytes: 0 %
Lymphocytes Absolute: 2.1 10*3/uL (ref 0.7–3.1)
Lymphs: 16 %
MCH: 32.6 pg (ref 26.6–33.0)
MCHC: 33.7 g/dL (ref 31.5–35.7)
MCV: 97 fL (ref 79–97)
Monocytes Absolute: 0.7 10*3/uL (ref 0.1–0.9)
Monocytes: 5 %
Neutrophils Absolute: 10.2 10*3/uL — ABNORMAL HIGH (ref 1.4–7.0)
Neutrophils: 77 %
Platelets: 282 10*3/uL (ref 150–450)
RBC: 4.29 x10E6/uL (ref 3.77–5.28)
RDW: 14.3 % (ref 11.7–15.4)
WBC: 13.3 10*3/uL — ABNORMAL HIGH (ref 3.4–10.8)

## 2020-04-17 LAB — LIPID PANEL
Chol/HDL Ratio: 2.5 ratio (ref 0.0–4.4)
Cholesterol, Total: 132 mg/dL (ref 100–199)
HDL: 53 mg/dL (ref >39)
LDL Chol Calc (NIH): 52 mg/dL (ref 0–99)
Triglycerides: 162 mg/dL — ABNORMAL HIGH (ref 0–149)
VLDL Cholesterol Cal: 27 mg/dL (ref 5–40)

## 2020-04-17 MED ORDER — SERTRALINE HCL 50 MG PO TABS: 50.0000 mg | ORAL_TABLET | Freq: Every day | ORAL | 1 refills | Status: DC

## 2020-04-17 MED ORDER — METOPROLOL TARTRATE 50 MG PO TABS: 50.0000 mg | ORAL_TABLET | Freq: Two times a day (BID) | ORAL | 1 refills | Status: DC

## 2020-04-17 MED ORDER — AMLODIPINE BESYLATE 10 MG PO TABS: 10.0000 mg | ORAL_TABLET | Freq: Every day | ORAL | 1 refills | Status: DC

## 2020-04-17 MED ORDER — SIMVASTATIN 40 MG PO TABS: 40.0000 mg | ORAL_TABLET | Freq: Every day | ORAL | 3 refills | Status: DC

## 2020-04-17 MED ORDER — PANTOPRAZOLE SODIUM 40 MG PO TBEC: DELAYED_RELEASE_TABLET | ORAL | 1 refills | Status: DC

## 2020-04-18 ENCOUNTER — Telehealth: Payer: Self-pay

## 2020-04-18 DIAGNOSIS — D72829 Elevated white blood cell count, unspecified: Secondary | ICD-10-CM

## 2020-04-18 NOTE — Telephone Encounter (Signed)
CBC placed. She can get it in 2-4 weeks.

## 2020-04-18 NOTE — Telephone Encounter (Signed)
Patient returned call and was read lab note By Osvaldo Angst PA. Patient verbalized understaning of all information.  Patent will come in for repeat lab as directed in lab notation. Please place future order  per lab note.

## 2020-04-18 NOTE — Telephone Encounter (Signed)
LMTCB, PEC may give results. 

## 2020-04-18 NOTE — Telephone Encounter (Signed)
-----   Message from Trey Sailors, [place] sent at 04/17/2020  1:46 PM EDT ----- Her sugars, kidney and liver function look OK. Her cholesterol is well controlled. Her white count is elevated from three years ago. This could be due to many different reasons including smoking, infection, and on certain occasions cancer. I can repeat the CBC in a couple of weeks to confirm the findings. If they are still high at that point I think she would need to see hematology oncology. If patient agreeable, can place CBC w/ diff under leukocytosis as a future order.

## 2020-04-24 ENCOUNTER — Ambulatory Visit: Payer: Medicare HMO

## 2020-05-01 ENCOUNTER — Ambulatory Visit: Payer: Medicare HMO | Attending: Internal Medicine

## 2020-05-01 DIAGNOSIS — Z23 Encounter for immunization: Secondary | ICD-10-CM

## 2020-05-01 NOTE — Progress Notes (Signed)
   Covid-19 Vaccination Clinic  Name:  INDEA DEARMAN    MRN: [id number] DOB: [birth year]  05/01/2020  Ms. Levins was observed post Covid-19 immunization for 15 minutes without incident. She was provided with Vaccine Information Sheet and instruction to access the V-Safe system.   Ms. Kildow was instructed to call 911 with any severe reactions post vaccine: Marland Kitchen Difficulty breathing  . Swelling of face and throat  . A fast heartbeat  . A bad rash all over body  . Dizziness and weakness   Immunizations Administered    Name Date Dose VIS Date Route   Pfizer COVID-19 Vaccine 05/01/2020  2:22 PM 0.3 mL 02/15/2019 Intramuscular   Manufacturer: [employer], [employer]   Lot: [id number]   NDC: [address]-9

## 2020-05-03 ENCOUNTER — Other Ambulatory Visit: Payer: Self-pay | Admitting: Physician Assistant

## 2020-05-03 DIAGNOSIS — F419 Anxiety disorder, unspecified: Secondary | ICD-10-CM

## 2020-05-03 MED ORDER — SERTRALINE HCL 50 MG PO TABS: 75.0000 mg | ORAL_TABLET | Freq: Every day | ORAL | 1 refills | Status: DC

## 2020-05-03 NOTE — Telephone Encounter (Signed)
It's a fine dose for her to take. If it is working for her, she can keep taking it. I sent in a new Rx. If it is not working she can always come back and see [place] before her follow up.

## 2020-05-03 NOTE — Addendum Note (Signed)
Addended by: Trey Sailors on: 05/03/2020 03:40 PM   Modules accepted: Orders

## 2020-05-03 NOTE — Telephone Encounter (Signed)
Pt called stating that she has been taking one and a half pills due to the one pill not doing the job. Please advise.

## 2020-05-10 ENCOUNTER — Telehealth: Payer: Self-pay | Admitting: Physician Assistant

## 2020-05-10 NOTE — Telephone Encounter (Signed)
Left message for patient to schedule Annual Wellness Visit. Marland KitchenPlease schedule with Nurse Health Advisor Hyacinth Meeker, RN at [facility].  If patient returns call, please schedule AWVI with [facility]

## 2020-05-18 ENCOUNTER — Ambulatory Visit: Payer: Medicare HMO | Attending: Internal Medicine

## 2020-05-18 DIAGNOSIS — Z20822 Contact with and (suspected) exposure to covid-19: Secondary | ICD-10-CM

## 2020-05-19 LAB — SARS-COV-2, NAA 2 DAY TAT

## 2020-05-19 LAB — NOVEL CORONAVIRUS, NAA: SARS-CoV-2, NAA: NOT DETECTED

## 2020-06-07 DIAGNOSIS — T444X5A Adverse effect of predominantly alpha-adrenoreceptor agonists, initial encounter: Secondary | ICD-10-CM | POA: Diagnosis not present

## 2020-06-07 DIAGNOSIS — J323 Chronic sphenoidal sinusitis: Secondary | ICD-10-CM | POA: Diagnosis not present

## 2020-06-07 DIAGNOSIS — J3489 Other specified disorders of nose and nasal sinuses: Secondary | ICD-10-CM | POA: Diagnosis not present

## 2020-06-21 DIAGNOSIS — J323 Chronic sphenoidal sinusitis: Secondary | ICD-10-CM | POA: Diagnosis not present

## 2020-06-26 DIAGNOSIS — J3489 Other specified disorders of nose and nasal sinuses: Secondary | ICD-10-CM | POA: Diagnosis not present

## 2020-06-26 DIAGNOSIS — J323 Chronic sphenoidal sinusitis: Secondary | ICD-10-CM | POA: Diagnosis not present

## 2020-06-26 DIAGNOSIS — J342 Deviated nasal septum: Secondary | ICD-10-CM | POA: Diagnosis not present

## 2020-07-19 ENCOUNTER — Telehealth: Payer: Self-pay | Admitting: Physician Assistant

## 2020-07-19 ENCOUNTER — Telehealth: Payer: Self-pay

## 2020-07-19 NOTE — Telephone Encounter (Signed)
Noted. FYI 

## 2020-07-19 NOTE — Telephone Encounter (Signed)
Copied from CRM [id number]. Topic: Medicare AWV >> Jul 19, 2020  1:20 PM Claudette Laws R wrote: Reason for CRM:   Left message for patient to call back and schedule Medicare Annual Wellness Visit (AWV) either virtually or in office.  No hx of AWV  Please schedule at anytime with [facility] Health Advisor.

## 2020-07-19 NOTE — Telephone Encounter (Signed)
Copied from CRM [id number]. Topic: Medicare AWV >> Jul 19, 2020  1:20 PM Claudette Laws R wrote: Reason for CRM:   Left message for patient to call back and schedule Medicare Annual Wellness Visit (AWV) either virtually or in office.  No hx of AWV  Please schedule at anytime with [facility] Health Advisor. >> Jul 19, 2020  2:04 PM Tamela Oddi wrote: Patient returned the call and said that this was not a good time to schedule an AWV.  She stated she will call back when she is ready to schedule an appt.

## 2020-10-16 ENCOUNTER — Ambulatory Visit: Payer: Self-pay | Admitting: Physician Assistant

## 2020-11-02 ENCOUNTER — Other Ambulatory Visit: Payer: Self-pay | Admitting: Physician Assistant

## 2020-11-02 DIAGNOSIS — F419 Anxiety disorder, unspecified: Secondary | ICD-10-CM

## 2020-11-02 DIAGNOSIS — I10 Essential (primary) hypertension: Secondary | ICD-10-CM

## 2020-11-03 NOTE — Telephone Encounter (Signed)
Called pt and LM to call office to schedule appt Requested Prescriptions  Pending Prescriptions Disp Refills  . sertraline (ZOLOFT) 50 MG tablet [Pharmacy Med Name: Sertraline HCl 50 MG Oral Tablet] 135 tablet     Sig: TAKE 1 & 1/2 (ONE & ONE-HALF) TABLETS BY MOUTH DAILY     Psychiatry:  Antidepressants - SSRI Failed - 11/02/2020  6:12 PM      Failed - Valid encounter within last 6 months    Recent Outpatient Visits          6 months ago Annual physical exam   [facility] Trey Sailors, [place]   1 year ago Anxiety   [facility] Osvaldo Angst M, [place]   1 year ago Cystitis   [facility] Osvaldo Angst M, [place]   1 year ago Insomnia, unspecified type   [facility] Osvaldo Angst M, [place]   1 year ago Insomnia, unspecified type   [facility] [place], Adriana M, [place]             . metoprolol tartrate (LOPRESSOR) 50 MG tablet [Pharmacy Med Name: Metoprolol Tartrate 50 MG Oral Tablet] 60 tablet 0    Sig: Take 1 tablet by mouth twice daily     Cardiovascular:  Beta Blockers Failed - 11/02/2020  6:12 PM      Failed - Last BP in normal range    BP Readings from Last 1 Encounters:  04/16/20 (!) 158/96         Failed - Valid encounter within last 6 months    Recent Outpatient Visits          6 months ago Annual physical exam   [facility] Trey Sailors, [place]   1 year ago Anxiety   [facility] [place], Lavella Hammock, [place]   1 year ago Cystitis   [facility] [place], Ricki Rodriguez M, [place]   1 year ago Insomnia, unspecified type   [facility] [place], [place], [place]   1 year ago Insomnia, unspecified type   [facility] [place], Adriana M, [place]             Passed - Last Heart Rate in normal range    Pulse Readings from Last 1 Encounters:  04/16/20 66         . amLODipine (NORVASC) 10 MG tablet [Pharmacy Med Name: amLODIPine  Besylate 10 MG Oral Tablet] 30 tablet 0    Sig: Take 1 tablet by mouth once daily     Cardiovascular:  Calcium Channel Blockers Failed - 11/02/2020  6:12 PM      Failed - Last BP in normal range    BP Readings from Last 1 Encounters:  04/16/20 (!) 158/96         Failed - Valid encounter within last 6 months    Recent Outpatient Visits          6 months ago Annual physical exam   [facility] [place], Lavella Hammock, [place]   1 year ago Anxiety   [facility] [place], Lavella [place], [place]   1 year ago Cystitis   [facility] Osvaldo Angst M, [place]   1 year ago Insomnia, unspecified type   [facility] [place], [place], [place]   1 year ago Insomnia, unspecified type   [facility] [place], [place], [place]

## 2020-11-03 NOTE — Telephone Encounter (Signed)
Requested medication (s) are due for refill today: yes  Requested medication (s) are on the active medication list: expired  Last refill:  05/03/20  Future visit scheduled: no  Notes to clinic:  Med expired 10/30/20    Requested Prescriptions  Pending Prescriptions Disp Refills   sertraline (ZOLOFT) 50 MG tablet [Pharmacy Med Name: Sertraline HCl 50 MG Oral Tablet] 135 tablet     Sig: TAKE 1 & 1/2 (ONE & ONE-HALF) TABLETS BY MOUTH DAILY      Psychiatry:  Antidepressants - SSRI Failed - 11/02/2020  6:12 PM      Failed - Valid encounter within last 6 months    Recent Outpatient Visits           6 months ago Annual physical exam   [facility] Trey Sailors, [place]   1 year ago Anxiety   [facility] [place], Lavella Hammock, [place]   1 year ago Cystitis   [facility] Osvaldo Angst M, [place]   1 year ago Insomnia, unspecified type   [facility] [place], [place], PA-C   1 year ago Insomnia, unspecified type   [facility] [place], Adriana M, [place]               Signed Prescriptions Disp Refills   metoprolol tartrate (LOPRESSOR) 50 MG tablet 60 tablet 0    Sig: Take 1 tablet by mouth twice daily      Cardiovascular:  Beta Blockers Failed - 11/02/2020  6:12 PM      Failed - Last BP in normal range    BP Readings from Last 1 Encounters:  04/16/20 (!) 158/96          Failed - Valid encounter within last 6 months    Recent Outpatient Visits           6 months ago Annual physical exam   [facility] [place], Lavella Hamm[place], [place]   1 year ago Anxiety   [facility] [place], [place], [place]   1 year ago Cystitis   [facility] Osvaldo Angst M, [place]   1 year ago Insomnia, unspecified type   [facility] [place], Ricki Rodriguez M, [place]   1 year ago Insomnia, unspecified type   [facility], Adriana M, [place]              Passed - Last  Heart Rate in normal range    Pulse Readings from Last 1 Encounters:  04/16/20 66            amLODipine (NORVASC) 10 MG tablet 30 tablet 0    Sig: Take 1 tablet by mouth once daily      Cardiovascular:  Calcium Channel Blockers Failed - 11/02/2020  6:12 PM      Failed - Last BP in normal range    BP Readings from Last 1 Encounters:  04/16/20 (!) 158/96          Failed - Valid encounter within last 6 months    Recent Outpatient Visits           6 months ago Annual physical exam   [facility] [place], Lavella [place], [place]   1 year ago Anxiety   [facility] [place], Lavella [place], [place]   1 year ago Cystitis   [facility] [place], Ricki Rodriguez M, [place]   1 year ago Insomnia, unspecified type   [facility] [place], [place], [place]  1 year ago Insomnia, unspecified type   [facility] [place], [place], [place]

## 2020-11-06 ENCOUNTER — Encounter: Payer: Self-pay | Admitting: Physician Assistant

## 2020-11-06 ENCOUNTER — Other Ambulatory Visit: Payer: Self-pay

## 2020-11-06 ENCOUNTER — Ambulatory Visit (INDEPENDENT_AMBULATORY_CARE_PROVIDER_SITE_OTHER): Payer: Medicare HMO | Admitting: Physician Assistant

## 2020-11-06 VITALS — BP 103/66 | HR 82 | Temp 98.6°F | Wt 122.8 lb

## 2020-11-06 DIAGNOSIS — Z5320 Procedure and treatment not carried out because of patient's decision for unspecified reasons: Secondary | ICD-10-CM

## 2020-11-06 DIAGNOSIS — Z720 Tobacco use: Secondary | ICD-10-CM

## 2020-11-06 DIAGNOSIS — I10 Essential (primary) hypertension: Secondary | ICD-10-CM

## 2020-11-06 DIAGNOSIS — E782 Mixed hyperlipidemia: Secondary | ICD-10-CM | POA: Diagnosis not present

## 2020-11-06 DIAGNOSIS — Z2821 Immunization not carried out because of patient refusal: Secondary | ICD-10-CM

## 2020-11-06 DIAGNOSIS — Z23 Encounter for immunization: Secondary | ICD-10-CM

## 2020-11-06 DIAGNOSIS — Z8719 Personal history of other diseases of the digestive system: Secondary | ICD-10-CM | POA: Diagnosis not present

## 2020-11-06 DIAGNOSIS — F419 Anxiety disorder, unspecified: Secondary | ICD-10-CM | POA: Diagnosis not present

## 2020-11-06 DIAGNOSIS — R69 Illness, unspecified: Secondary | ICD-10-CM | POA: Diagnosis not present

## 2020-11-06 MED ORDER — AMLODIPINE BESYLATE 10 MG PO TABS: 10.0000 mg | ORAL_TABLET | Freq: Every day | ORAL | 1 refills | Status: AC

## 2020-11-06 MED ORDER — METOPROLOL TARTRATE 50 MG PO TABS: 50.0000 mg | ORAL_TABLET | Freq: Two times a day (BID) | ORAL | 1 refills | Status: AC

## 2020-11-06 MED ORDER — PANTOPRAZOLE SODIUM 40 MG PO TBEC: DELAYED_RELEASE_TABLET | ORAL | 1 refills | Status: DC

## 2020-11-06 MED ORDER — SERTRALINE HCL 50 MG PO TABS: 75.0000 mg | ORAL_TABLET | Freq: Every day | ORAL | 1 refills | Status: AC

## 2020-11-06 MED ORDER — SIMVASTATIN 40 MG PO TABS: 40.0000 mg | ORAL_TABLET | Freq: Every day | ORAL | 3 refills | Status: DC

## 2020-11-06 NOTE — Progress Notes (Signed)
Established patient visit   Patient: Melanie White   DOB: 08/23/1946   74 y.o. Female  MRN: [id number] Visit Date: 11/06/2020  Today's healthcare provider: Trey Sailors, PA-C   Chief Complaint  Patient presents with  . Hypertension  I,Porsha C McClurkin,acting as a scribe for [employer], PA-C.,have documented all relevant documentation on the behalf of Trey Sailors, PA-C,as directed by  Trey Sailors, PA-C while in the presence of Trey Sailors, PA-C.  Subjective    HPI  Hypertension, follow-up  BP Readings from Last 3 Encounters:  11/06/20 103/66  04/16/20 (!) 158/96  08/23/19 126/77   Wt Readings from Last 3 Encounters:  11/06/20 122 lb 12.8 oz (55.7 kg)  04/16/20 120 lb 12.8 oz (54.8 kg)  08/23/19 112 lb (50.8 kg)     She was last seen for hypertension 7 months ago.  BP at that visit was 158/96. Management since that visit includes continue current medication.  She reports good compliance with treatment. She is not having side effects.  She is following a Regular diet. She is not exercising. She does smoke.  Use of agents associated with hypertension: none.   Outside blood pressures are not being checked. Symptoms: No chest pain No chest pressure  No palpitations No syncope  No dyspnea No orthopnea  No paroxysmal nocturnal dyspnea No lower extremity edema   Pertinent labs: Lab Results  Component Value Date   CHOL 132 04/16/2020   HDL 53 04/16/2020   LDLCALC 52 04/16/2020   TRIG 162 (H) 04/16/2020   CHOLHDL 2.5 04/16/2020   Lab Results  Component Value Date   NA 143 04/16/2020   K 4.5 04/16/2020   CREATININE 0.85 04/16/2020   GFRNONAA 67 04/16/2020   GFRAA 78 04/16/2020   GLUCOSE 97 04/16/2020     The 10-year ASCVD risk score Denman George DC Jr., et al., 2013) is: 19.4%   --------------------------------------------------------------------------------------------------- Lipid/Cholesterol, Follow-up  Last lipid panel Other  pertinent labs  Lab Results  Component Value Date   CHOL 132 04/16/2020   HDL 53 04/16/2020   LDLCALC 52 04/16/2020   TRIG 162 (H) 04/16/2020   CHOLHDL 2.5 04/16/2020   Lab Results  Component Value Date   ALT 9 04/16/2020   AST 19 04/16/2020   PLT 282 04/16/2020   TSH 1.390 11/30/2015     She was last seen for this 6 months ago.  Management since that visit includes continue statin.  She reports excellent compliance with treatment. She is not having side effects.   Symptoms: No chest pain No chest pressure/discomfort  No dyspnea No lower extremity edema  No numbness or tingling of extremity No orthopnea  No palpitations No paroxysmal nocturnal dyspnea  No speech difficulty No syncope   Current diet: not asked Current exercise: none  The 10-year ASCVD risk score Denman George DC Jr., et al., 2013) is: 19.4%  --------------------------------------------------------------------------------------------------- Anxiety, Follow-up  She was last seen for anxiety 6 months ago. Changes made at last visit include continue zoloft.   She reports excellent compliance with treatment. She reports excellent tolerance of treatment. She is not having side effects.   She feels her anxiety is moderate and Unchanged since last visit.  Symptoms: No chest pain No difficulty concentrating  No dizziness No fatigue  No feelings of losing control No insomnia  No irritable No palpitations  No panic attacks No racing thoughts  No shortness of breath No sweating  No tremors/shakes  GAD-7 Results GAD-7 Generalized Anxiety Disorder Screening Tool 06/02/2019  1. Feeling Nervous, Anxious, or on Edge 3  2. Not Being Able to Stop or Control Worrying 1  3. Worrying Too Much About Different Things 1  4. Trouble Relaxing 3  5. Being So Restless it's Hard To Sit Still 2  6. Becoming Easily Annoyed or Irritable 1  7. Feeling Afraid As If Something Awful Might Happen 1  Total GAD-7 Score 12    PHQ-9  Scores PHQ9 SCORE ONLY 11/06/2020 04/16/2020 12/20/2018  PHQ-9 Total Score 4 6 2     ---------------------------------------------------------------------------------------------------  She has been seeing Dr. and has chronic sinusitis and deviated septum.      Medications: Outpatient Medications Prior to Visit  Medication Sig  . TURMERIC PO Take by mouth.  . [DISCONTINUED] amLODipine (NORVASC) 10 MG tablet Take 1 tablet by mouth once daily  . [DISCONTINUED] metoprolol tartrate (LOPRESSOR) 50 MG tablet Take 1 tablet by mouth twice daily  . [DISCONTINUED] pantoprazole (PROTONIX) 40 MG tablet Take one daily as needed for hearburn not relieved by pantoprazole.  . [DISCONTINUED] simvastatin (ZOCOR) 40 MG tablet Take 1 tablet (40 mg total) by mouth at bedtime.  . [DISCONTINUED] doxepin (SINEQUAN) 10 MG/ML solution Take 0.3 mLs (3 mg total) by mouth at bedtime. (Patient not taking: Reported on 04/16/2020)  . [DISCONTINUED] sertraline (ZOLOFT) 50 MG tablet Take 1.5 tablets (75 mg total) by mouth daily.   No facility-administered medications prior to visit.    Review of Systems  Constitutional: Negative.   Respiratory: Negative.   Cardiovascular: Negative.   Musculoskeletal: Negative.   Hematological: Negative.       Objective    BP 103/66 (BP Location: Left Arm, Patient Position: Sitting, Cuff Size: Large)   Pulse 82   Temp 98.6 F (37 C) (Oral)   Wt 122 lb 12.8 oz (55.7 kg)   SpO2 97%   BMI 19.82 kg/m    Physical Exam Constitutional:      Appearance: Normal appearance. She is normal weight.  Cardiovascular:     Rate and Rhythm: Normal rate and regular rhythm.     Heart sounds: Normal heart sounds.  Pulmonary:     Effort: Pulmonary effort is normal.     Breath sounds: Wheezing present.  Skin:    General: Skin is warm and dry.  Neurological:     General: No focal deficit present.     Mental Status: She is alert and oriented to person, place, and time.   Psychiatric:        Mood and Affect: Mood normal.        Behavior: Behavior normal.       No results found for any visits on 11/06/20.  Assessment & Plan    1. Primary hypertension  Well controlled, continue medications.  2. Tobacco abuse  Not interested in quitting.  3. Essential hypertension  - amLODipine (NORVASC) 10 MG tablet; Take 1 tablet (10 mg total) by mouth daily.  Dispense: 90 tablet; Refill: 1 - metoprolol tartrate (LOPRESSOR) 50 MG tablet; Take 1 tablet (50 mg total) by mouth 2 (two) times daily.  Dispense: 180 tablet; Refill: 1  4. Anxiety  - sertraline (ZOLOFT) 50 MG tablet; Take 1.5 tablets (75 mg total) by mouth daily.  Dispense: 135 tablet; Refill: 1  5. Hx of gastritis  - pantoprazole (PROTONIX) 40 MG tablet; Take one daily as needed for hearburn not relieved by pantoprazole.  Dispense: 90 tablet; Refill: 1  6. Mixed hyperlipidemia  -  simvastatin (ZOCOR) 40 MG tablet; Take 1 tablet (40 mg total) by mouth at bedtime.  Dispense: 90 tablet; Refill: 3  7. Influenza vaccination declined   8. Pneumococcal vaccination declined   9. Colon cancer screening declined   10. Mammogram declined    Return in about 1 year (around 11/06/2021) for annual physical and follow up .      ITrey Sailors, PA-C, have reviewed all documentation for this visit. The documentation on 11/07/20 for the exam, diagnosis, procedures, and orders are all accurate and complete.  The entirety of the information documented in the History of Present Illness, Review of Systems and Physical Exam were personally obtained by me. Portions of this information were initially documented by [facility] and reviewed by me for thoroughness and accuracy.     Maryella Shivers  [facility] [phone] (phone) [phone] (fax)  [facility]

## 2020-11-06 NOTE — Patient Instructions (Signed)
Health Maintenance, Female Adopting a healthy lifestyle and getting preventive care are important in promoting health and wellness. Ask your health care provider about:  The right schedule for you to have regular tests and exams.  Things you can do on your own to prevent diseases and keep yourself healthy. What should I know about diet, weight, and exercise? Eat a healthy diet   Eat a diet that includes plenty of vegetables, fruits, low-fat dairy products, and lean protein.  Do not eat a lot of foods that are high in solid fats, added sugars, or sodium. Maintain a healthy weight Body mass index (BMI) is used to identify weight problems. It estimates body fat based on height and weight. Your health care provider can help determine your BMI and help you achieve or maintain a healthy weight. Get regular exercise Get regular exercise. This is one of the most important things you can do for your health. Most adults should:  Exercise for at least 150 minutes each week. The exercise should increase your heart rate and make you sweat (moderate-intensity exercise).  Do strengthening exercises at least twice a week. This is in addition to the moderate-intensity exercise.  Spend less time sitting. Even light physical activity can be beneficial. Watch cholesterol and blood lipids Have your blood tested for lipids and cholesterol at 75 years of age, then have this test every 5 years. Have your cholesterol levels checked more often if:  Your lipid or cholesterol levels are high.  You are older than 75 years of age.  You are at high risk for heart disease. What should I know about cancer screening? Depending on your health history and family history, you may need to have cancer screening at various ages. This may include screening for:  Breast cancer.  Cervical cancer.  Colorectal cancer.  Skin cancer.  Lung cancer. What should I know about heart disease, diabetes, and high blood  pressure? Blood pressure and heart disease  High blood pressure causes heart disease and increases the risk of stroke. This is more likely to develop in people who have high blood pressure readings, are of African descent, or are overweight.  Have your blood pressure checked: ? Every 3-5 years if you are 18-39 years of age. ? Every year if you are 40 years old or older. Diabetes Have regular diabetes screenings. This checks your fasting blood sugar level. Have the screening done:  Once every three years after age 40 if you are at a normal weight and have a low risk for diabetes.  More often and at a younger age if you are overweight or have a high risk for diabetes. What should I know about preventing infection? Hepatitis B If you have a higher risk for hepatitis B, you should be screened for this virus. Talk with your health care provider to find out if you are at risk for hepatitis B infection. Hepatitis C Testing is recommended for:  Everyone born from 1945 through 1965.  Anyone with known risk factors for hepatitis C. Sexually transmitted infections (STIs)  Get screened for STIs, including gonorrhea and chlamydia, if: ? You are sexually active and are younger than 75 years of age. ? You are older than 75 years of age and your health care provider tells you that you are at risk for this type of infection. ? Your sexual activity has changed since you were last screened, and you are at increased risk for chlamydia or gonorrhea. Ask your health care provider if   you are at risk.  Ask your health care provider about whether you are at high risk for HIV. Your health care provider may recommend a prescription medicine to help prevent HIV infection. If you choose to take medicine to prevent HIV, you should first get tested for HIV. You should then be tested every 3 months for as long as you are taking the medicine. Pregnancy  If you are about to stop having your period (premenopausal) and  you may become pregnant, seek counseling before you get pregnant.  Take 400 to 800 micrograms (mcg) of folic acid every day if you become pregnant.  Ask for birth control (contraception) if you want to prevent pregnancy. Osteoporosis and menopause Osteoporosis is a disease in which the bones lose minerals and strength with aging. This can result in bone fractures. If you are 65 years old or older, or if you are at risk for osteoporosis and fractures, ask your health care provider if you should:  Be screened for bone loss.  Take a calcium or vitamin D supplement to lower your risk of fractures.  Be given hormone replacement therapy (HRT) to treat symptoms of menopause. Follow these instructions at home: Lifestyle  Do not use any products that contain nicotine or tobacco, such as cigarettes, e-cigarettes, and chewing tobacco. If you need help quitting, ask your health care provider.  Do not use street drugs.  Do not share needles.  Ask your health care provider for help if you need support or information about quitting drugs. Alcohol use  Do not drink alcohol if: ? Your health care provider tells you not to drink. ? You are pregnant, may be pregnant, or are planning to become pregnant.  If you drink alcohol: ? Limit how much you use to 0-1 drink a day. ? Limit intake if you are breastfeeding.  Be aware of how much alcohol is in your drink. In the [place] one drink equals one 12 oz bottle of beer (355 mL), one 5 oz glass of wine (148 mL), or one 1 oz glass of hard liquor (44 mL). General instructions  Schedule regular health, dental, and eye exams.  Stay current with your vaccines.  Tell your health care provider if: ? You often feel depressed. ? You have ever been abused or do not feel safe at home. Summary  Adopting a healthy lifestyle and getting preventive care are important in promoting health and wellness.  Follow your health care provider's instructions about healthy  diet, exercising, and getting tested or screened for diseases.  Follow your health care provider's instructions on monitoring your cholesterol and blood pressure. This information is not intended to replace advice given to you by your health care provider. Make sure you discuss any questions you have with your health care provider. Document Revised: 12/01/2018 Document Reviewed: 12/01/2018 Elsevier Patient Education  2020 Elsevier Inc.  

## 2020-11-08 ENCOUNTER — Ambulatory Visit: Payer: Medicare HMO | Admitting: Physician Assistant

## 2020-12-04 ENCOUNTER — Other Ambulatory Visit: Payer: Self-pay | Admitting: Physician Assistant

## 2020-12-04 DIAGNOSIS — I10 Essential (primary) hypertension: Secondary | ICD-10-CM

## 2021-02-09 ENCOUNTER — Emergency Department: Payer: Medicare Other

## 2021-02-09 ENCOUNTER — Inpatient Hospital Stay
Admission: EM | Admit: 2021-02-09 | Discharge: 2021-03-11 | DRG: 853 | Disposition: A | Discharge status: Skilled Nursing Facility | Payer: Medicare Other | Attending: Hospitalist | Admitting: Hospitalist

## 2021-02-09 ENCOUNTER — Other Ambulatory Visit: Payer: Self-pay

## 2021-02-09 DIAGNOSIS — K920 Hematemesis: Secondary | ICD-10-CM | POA: Diagnosis not present

## 2021-02-09 DIAGNOSIS — N179 Acute kidney failure, unspecified: Secondary | ICD-10-CM

## 2021-02-09 DIAGNOSIS — E785 Hyperlipidemia, unspecified: Secondary | ICD-10-CM | POA: Diagnosis not present

## 2021-02-09 DIAGNOSIS — Z471 Aftercare following joint replacement surgery: Secondary | ICD-10-CM | POA: Diagnosis not present

## 2021-02-09 DIAGNOSIS — N202 Calculus of kidney with calculus of ureter: Secondary | ICD-10-CM | POA: Diagnosis present

## 2021-02-09 DIAGNOSIS — D649 Anemia, unspecified: Secondary | ICD-10-CM | POA: Diagnosis not present

## 2021-02-09 DIAGNOSIS — E162 Hypoglycemia, unspecified: Secondary | ICD-10-CM | POA: Diagnosis not present

## 2021-02-09 DIAGNOSIS — F339 Major depressive disorder, recurrent, unspecified: Secondary | ICD-10-CM | POA: Diagnosis not present

## 2021-02-09 DIAGNOSIS — K2970 Gastritis, unspecified, without bleeding: Secondary | ICD-10-CM | POA: Diagnosis not present

## 2021-02-09 DIAGNOSIS — R197 Diarrhea, unspecified: Secondary | ICD-10-CM

## 2021-02-09 DIAGNOSIS — R6520 Severe sepsis without septic shock: Secondary | ICD-10-CM | POA: Diagnosis present

## 2021-02-09 DIAGNOSIS — Z66 Do not resuscitate: Secondary | ICD-10-CM | POA: Diagnosis not present

## 2021-02-09 DIAGNOSIS — E860 Dehydration: Secondary | ICD-10-CM | POA: Diagnosis present

## 2021-02-09 DIAGNOSIS — E43 Unspecified severe protein-calorie malnutrition: Secondary | ICD-10-CM | POA: Insufficient documentation

## 2021-02-09 DIAGNOSIS — R339 Retention of urine, unspecified: Secondary | ICD-10-CM | POA: Diagnosis not present

## 2021-02-09 DIAGNOSIS — U071 COVID-19: Secondary | ICD-10-CM | POA: Diagnosis not present

## 2021-02-09 DIAGNOSIS — F32A Depression, unspecified: Secondary | ICD-10-CM | POA: Diagnosis present

## 2021-02-09 DIAGNOSIS — E876 Hypokalemia: Secondary | ICD-10-CM | POA: Diagnosis present

## 2021-02-09 DIAGNOSIS — K208 Other esophagitis without bleeding: Secondary | ICD-10-CM | POA: Diagnosis not present

## 2021-02-09 DIAGNOSIS — A4189 Other specified sepsis: Principal | ICD-10-CM | POA: Diagnosis present

## 2021-02-09 DIAGNOSIS — F419 Anxiety disorder, unspecified: Secondary | ICD-10-CM | POA: Diagnosis present

## 2021-02-09 DIAGNOSIS — Z818 Family history of other mental and behavioral disorders: Secondary | ICD-10-CM

## 2021-02-09 DIAGNOSIS — K922 Gastrointestinal hemorrhage, unspecified: Secondary | ICD-10-CM | POA: Diagnosis not present

## 2021-02-09 DIAGNOSIS — M25551 Pain in right hip: Secondary | ICD-10-CM | POA: Diagnosis not present

## 2021-02-09 DIAGNOSIS — I10 Essential (primary) hypertension: Secondary | ICD-10-CM | POA: Diagnosis not present

## 2021-02-09 DIAGNOSIS — R000 Tachycardia, unspecified: Secondary | ICD-10-CM | POA: Diagnosis not present

## 2021-02-09 DIAGNOSIS — E872 Acidosis: Secondary | ICD-10-CM | POA: Diagnosis not present

## 2021-02-09 DIAGNOSIS — R0902 Hypoxemia: Secondary | ICD-10-CM | POA: Diagnosis not present

## 2021-02-09 DIAGNOSIS — Z79899 Other long term (current) drug therapy: Secondary | ICD-10-CM

## 2021-02-09 DIAGNOSIS — R809 Proteinuria, unspecified: Secondary | ICD-10-CM | POA: Diagnosis not present

## 2021-02-09 DIAGNOSIS — R58 Hemorrhage, not elsewhere classified: Secondary | ICD-10-CM | POA: Diagnosis not present

## 2021-02-09 DIAGNOSIS — K911 Postgastric surgery syndromes: Secondary | ICD-10-CM | POA: Diagnosis not present

## 2021-02-09 DIAGNOSIS — N201 Calculus of ureter: Secondary | ICD-10-CM

## 2021-02-09 DIAGNOSIS — D75839 Thrombocytosis, unspecified: Secondary | ICD-10-CM | POA: Diagnosis not present

## 2021-02-09 DIAGNOSIS — M6281 Muscle weakness (generalized): Secondary | ICD-10-CM | POA: Diagnosis not present

## 2021-02-09 DIAGNOSIS — Y92481 Parking lot as the place of occurrence of the external cause: Secondary | ICD-10-CM

## 2021-02-09 DIAGNOSIS — E870 Hyperosmolality and hypernatremia: Secondary | ICD-10-CM | POA: Diagnosis not present

## 2021-02-09 DIAGNOSIS — S72011D Unspecified intracapsular fracture of right femur, subsequent encounter for closed fracture with routine healing: Secondary | ICD-10-CM | POA: Diagnosis not present

## 2021-02-09 DIAGNOSIS — A0472 Enterocolitis due to Clostridium difficile, not specified as recurrent: Secondary | ICD-10-CM | POA: Diagnosis present

## 2021-02-09 DIAGNOSIS — I959 Hypotension, unspecified: Secondary | ICD-10-CM | POA: Diagnosis not present

## 2021-02-09 DIAGNOSIS — Z888 Allergy status to other drugs, medicaments and biological substances status: Secondary | ICD-10-CM

## 2021-02-09 DIAGNOSIS — D62 Acute posthemorrhagic anemia: Secondary | ICD-10-CM | POA: Diagnosis not present

## 2021-02-09 DIAGNOSIS — S72011A Unspecified intracapsular fracture of right femur, initial encounter for closed fracture: Secondary | ICD-10-CM

## 2021-02-09 DIAGNOSIS — S72001A Fracture of unspecified part of neck of right femur, initial encounter for closed fracture: Secondary | ICD-10-CM

## 2021-02-09 DIAGNOSIS — J9601 Acute respiratory failure with hypoxia: Secondary | ICD-10-CM | POA: Diagnosis present

## 2021-02-09 DIAGNOSIS — N132 Hydronephrosis with renal and ureteral calculous obstruction: Secondary | ICD-10-CM | POA: Diagnosis not present

## 2021-02-09 DIAGNOSIS — N170 Acute kidney failure with tubular necrosis: Secondary | ICD-10-CM | POA: Diagnosis not present

## 2021-02-09 DIAGNOSIS — K2210 Ulcer of esophagus without bleeding: Secondary | ICD-10-CM | POA: Diagnosis not present

## 2021-02-09 DIAGNOSIS — A0839 Other viral enteritis: Secondary | ICD-10-CM | POA: Diagnosis not present

## 2021-02-09 DIAGNOSIS — F17210 Nicotine dependence, cigarettes, uncomplicated: Secondary | ICD-10-CM | POA: Diagnosis present

## 2021-02-09 DIAGNOSIS — L8990 Pressure ulcer of unspecified site, unspecified stage: Secondary | ICD-10-CM | POA: Insufficient documentation

## 2021-02-09 DIAGNOSIS — T380X5A Adverse effect of glucocorticoids and synthetic analogues, initial encounter: Secondary | ICD-10-CM | POA: Diagnosis present

## 2021-02-09 DIAGNOSIS — A419 Sepsis, unspecified organism: Secondary | ICD-10-CM

## 2021-02-09 DIAGNOSIS — Z9889 Other specified postprocedural states: Secondary | ICD-10-CM | POA: Diagnosis not present

## 2021-02-09 DIAGNOSIS — L89151 Pressure ulcer of sacral region, stage 1: Secondary | ICD-10-CM | POA: Diagnosis present

## 2021-02-09 DIAGNOSIS — R1084 Generalized abdominal pain: Secondary | ICD-10-CM | POA: Diagnosis not present

## 2021-02-09 DIAGNOSIS — Z681 Body mass index (BMI) 19 or less, adult: Secondary | ICD-10-CM | POA: Diagnosis not present

## 2021-02-09 DIAGNOSIS — R278 Other lack of coordination: Secondary | ICD-10-CM | POA: Diagnosis not present

## 2021-02-09 DIAGNOSIS — K264 Chronic or unspecified duodenal ulcer with hemorrhage: Secondary | ICD-10-CM | POA: Diagnosis not present

## 2021-02-09 DIAGNOSIS — R319 Hematuria, unspecified: Secondary | ICD-10-CM | POA: Diagnosis not present

## 2021-02-09 DIAGNOSIS — Z515 Encounter for palliative care: Secondary | ICD-10-CM | POA: Diagnosis not present

## 2021-02-09 DIAGNOSIS — E46 Unspecified protein-calorie malnutrition: Secondary | ICD-10-CM | POA: Diagnosis not present

## 2021-02-09 DIAGNOSIS — N200 Calculus of kidney: Secondary | ICD-10-CM | POA: Diagnosis not present

## 2021-02-09 DIAGNOSIS — Z7189 Other specified counseling: Secondary | ICD-10-CM | POA: Diagnosis not present

## 2021-02-09 DIAGNOSIS — Z8616 Personal history of COVID-19: Secondary | ICD-10-CM | POA: Diagnosis not present

## 2021-02-09 DIAGNOSIS — K219 Gastro-esophageal reflux disease without esophagitis: Secondary | ICD-10-CM

## 2021-02-09 DIAGNOSIS — Z8249 Family history of ischemic heart disease and other diseases of the circulatory system: Secondary | ICD-10-CM

## 2021-02-09 DIAGNOSIS — Z724 Inappropriate diet and eating habits: Secondary | ICD-10-CM | POA: Diagnosis not present

## 2021-02-09 DIAGNOSIS — Z8719 Personal history of other diseases of the digestive system: Secondary | ICD-10-CM

## 2021-02-09 DIAGNOSIS — R1110 Vomiting, unspecified: Secondary | ICD-10-CM | POA: Diagnosis not present

## 2021-02-09 DIAGNOSIS — S72031A Displaced midcervical fracture of right femur, initial encounter for closed fracture: Secondary | ICD-10-CM | POA: Diagnosis not present

## 2021-02-09 DIAGNOSIS — Z96641 Presence of right artificial hip joint: Secondary | ICD-10-CM | POA: Diagnosis not present

## 2021-02-09 DIAGNOSIS — R109 Unspecified abdominal pain: Secondary | ICD-10-CM | POA: Diagnosis not present

## 2021-02-09 DIAGNOSIS — K921 Melena: Secondary | ICD-10-CM | POA: Diagnosis not present

## 2021-02-09 DIAGNOSIS — R531 Weakness: Secondary | ICD-10-CM | POA: Diagnosis not present

## 2021-02-09 DIAGNOSIS — Z743 Need for continuous supervision: Secondary | ICD-10-CM | POA: Diagnosis not present

## 2021-02-09 DIAGNOSIS — K209 Esophagitis, unspecified without bleeding: Secondary | ICD-10-CM | POA: Diagnosis not present

## 2021-02-09 DIAGNOSIS — Z90711 Acquired absence of uterus with remaining cervical stump: Secondary | ICD-10-CM

## 2021-02-09 DIAGNOSIS — S72009A Fracture of unspecified part of neck of unspecified femur, initial encounter for closed fracture: Secondary | ICD-10-CM

## 2021-02-09 DIAGNOSIS — K269 Duodenal ulcer, unspecified as acute or chronic, without hemorrhage or perforation: Secondary | ICD-10-CM | POA: Diagnosis not present

## 2021-02-09 LAB — CBC
HCT: 40.4 % (ref 36.0–46.0)
Hemoglobin: 13.4 g/dL (ref 12.0–15.0)
MCH: 31.6 pg (ref 26.0–34.0)
MCHC: 33.2 g/dL (ref 30.0–36.0)
MCV: 95.3 fL (ref 80.0–100.0)
Platelets: 424 10*3/uL — ABNORMAL HIGH (ref 150–400)
RBC: 4.24 MIL/uL (ref 3.87–5.11)
RDW: 14.5 % (ref 11.5–15.5)
WBC: 19.2 10*3/uL — ABNORMAL HIGH (ref 4.0–10.5)
nRBC: 0 % (ref 0.0–0.2)

## 2021-02-09 LAB — COMPREHENSIVE METABOLIC PANEL
ALT: 21 U/L (ref 0–44)
AST: 39 U/L (ref 15–41)
Albumin: 3.3 g/dL — ABNORMAL LOW (ref 3.5–5.0)
Alkaline Phosphatase: 74 U/L (ref 38–126)
Anion gap: 18 — ABNORMAL HIGH (ref 5–15)
BUN: 25 mg/dL — ABNORMAL HIGH (ref 8–23)
CO2: 28 mmol/L (ref 22–32)
Calcium: 10.6 mg/dL — ABNORMAL HIGH (ref 8.9–10.3)
Chloride: 94 mmol/L — ABNORMAL LOW (ref 98–111)
Creatinine, Ser: 1.21 mg/dL — ABNORMAL HIGH (ref 0.44–1.00)
GFR, Estimated: 47 mL/min — ABNORMAL LOW (ref >60)
Glucose, Bld: 134 mg/dL — ABNORMAL HIGH (ref 70–99)
Potassium: 3.8 mmol/L (ref 3.5–5.1)
Sodium: 140 mmol/L (ref 135–145)
Total Bilirubin: 1.4 mg/dL — ABNORMAL HIGH (ref 0.3–1.2)
Total Protein: 7.4 g/dL (ref 6.5–8.1)

## 2021-02-09 LAB — URINALYSIS, COMPLETE (UACMP) WITH MICROSCOPIC
Bacteria, UA: NONE SEEN
Bilirubin Urine: NEGATIVE
Glucose, UA: NEGATIVE mg/dL
Ketones, ur: 5 mg/dL — AB
Nitrite: NEGATIVE
Protein, ur: 100 mg/dL — AB
Specific Gravity, Urine: 1.014 (ref 1.005–1.030)
pH: 8 (ref 5.0–8.0)

## 2021-02-09 LAB — LIPASE, BLOOD: Lipase: 34 U/L (ref 11–51)

## 2021-02-09 LAB — RESP PANEL BY RT-PCR (FLU A&B, COVID) ARPGX2
Influenza A by PCR: NEGATIVE
Influenza B by PCR: NEGATIVE
SARS Coronavirus 2 by RT PCR: POSITIVE — AB

## 2021-02-09 LAB — LACTIC ACID, PLASMA
Lactic Acid, Venous: 2.3 mmol/L (ref 0.5–1.9)
Lactic Acid, Venous: 1.3 mmol/L (ref 0.5–1.9)

## 2021-02-09 LAB — TROPONIN I (HIGH SENSITIVITY): Troponin I (High Sensitivity): 23 ng/L — ABNORMAL HIGH (ref <18)

## 2021-02-09 MED ORDER — HYDROCODONE-ACETAMINOPHEN 5-325 MG PO TABS
1.0000 | ORAL_TABLET | Freq: Four times a day (QID) | ORAL | Status: DC | PRN
  Administered 2021-02-10: 2 via ORAL
  Administered 2021-02-10: 1 via ORAL
  Administered 2021-02-11: 2 via ORAL
  Filled 2021-02-09: qty 2
  Filled 2021-02-09: qty 1
  Filled 2021-02-09: qty 2

## 2021-02-09 MED ORDER — ONDANSETRON HCL 4 MG/2ML IJ SOLN
4.0000 mg | Freq: Once | INTRAMUSCULAR | Status: AC
  Administered 2021-02-09: 4 mg via INTRAVENOUS
  Filled 2021-02-09: qty 2

## 2021-02-09 MED ORDER — MORPHINE SULFATE (PF) 2 MG/ML IV SOLN
0.5000 mg | INTRAVENOUS | Status: DC | PRN
  Administered 2021-02-10 – 2021-02-11 (×2): 0.5 mg via INTRAVENOUS
  Filled 2021-02-09 (×2): qty 1

## 2021-02-09 MED ORDER — LABETALOL HCL 5 MG/ML IV SOLN
10.0000 mg | INTRAVENOUS | Status: DC | PRN
  Administered 2021-02-09: 10 mg via INTRAVENOUS
  Filled 2021-02-09: qty 4

## 2021-02-09 MED ORDER — IOHEXOL 300 MG/ML  SOLN
75.0000 mL | Freq: Once | INTRAMUSCULAR | Status: AC | PRN
  Administered 2021-02-09: 75 mL via INTRAVENOUS

## 2021-02-09 MED ORDER — LACTATED RINGERS IV BOLUS: 1000.0000 mL | Freq: Once | INTRAVENOUS | Status: DC

## 2021-02-09 MED ORDER — METRONIDAZOLE IN NACL 5-0.79 MG/ML-% IV SOLN
500.0000 mg | Freq: Once | INTRAVENOUS | Status: AC
  Administered 2021-02-09: 500 mg via INTRAVENOUS
  Filled 2021-02-09: qty 100

## 2021-02-09 MED ORDER — SODIUM CHLORIDE 0.9 % IV SOLN
2.0000 g | Freq: Once | INTRAVENOUS | Status: AC
  Administered 2021-02-09: 2 g via INTRAVENOUS
  Filled 2021-02-09: qty 2

## 2021-02-09 MED ORDER — AMLODIPINE BESYLATE 10 MG PO TABS
10.0000 mg | ORAL_TABLET | Freq: Every day | ORAL | Status: DC
  Administered 2021-02-11 – 2021-03-11 (×27): 10 mg via ORAL
  Filled 2021-02-09 (×28): qty 1

## 2021-02-09 MED ORDER — VANCOMYCIN HCL IN DEXTROSE 1-5 GM/200ML-% IV SOLN
1000.0000 mg | Freq: Once | INTRAVENOUS | Status: AC
  Administered 2021-02-09: 1000 mg via INTRAVENOUS
  Filled 2021-02-09: qty 200

## 2021-02-09 MED ORDER — LACTATED RINGERS IV BOLUS (SEPSIS)
500.0000 mL | Freq: Once | INTRAVENOUS | Status: AC
  Administered 2021-02-09: 500 mL via INTRAVENOUS

## 2021-02-09 MED ORDER — CEFAZOLIN SODIUM-DEXTROSE 2-4 GM/100ML-% IV SOLN
2.0000 g | INTRAVENOUS | Status: DC
  Filled 2021-02-09: qty 100

## 2021-02-09 MED ORDER — MORPHINE SULFATE (PF) 4 MG/ML IV SOLN
4.0000 mg | Freq: Once | INTRAVENOUS | Status: AC
  Administered 2021-02-09: 4 mg via INTRAVENOUS
  Filled 2021-02-09: qty 1

## 2021-02-09 MED ORDER — LACTATED RINGERS IV SOLN: INTRAVENOUS | Status: AC

## 2021-02-09 MED ORDER — LACTATED RINGERS IV BOLUS (SEPSIS)
1000.0000 mL | Freq: Once | INTRAVENOUS | Status: AC
  Administered 2021-02-09: 1000 mL via INTRAVENOUS

## 2021-02-09 MED ORDER — METOPROLOL TARTRATE 50 MG PO TABS: 50.0000 mg | ORAL_TABLET | Freq: Two times a day (BID) | ORAL | Status: DC

## 2021-02-09 MED ORDER — ZOLPIDEM TARTRATE 5 MG PO TABS
5.0000 mg | ORAL_TABLET | Freq: Every evening | ORAL | Status: DC | PRN
  Administered 2021-02-09 – 2021-02-24 (×7): 5 mg via ORAL
  Filled 2021-02-09 (×10): qty 1

## 2021-02-09 MED ORDER — LACTATED RINGERS IV BOLUS (SEPSIS)
250.0000 mL | Freq: Once | INTRAVENOUS | Status: AC
  Administered 2021-02-09: 250 mL via INTRAVENOUS
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
## 2021-02-09 NOTE — Progress Notes (Signed)
Code Sepsis initiated @ 1556 PM, [facility] following.

## 2021-02-09 NOTE — ED Notes (Signed)
Patient returned from radiology department 
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
## 2021-02-09 NOTE — Consult Note (Signed)
PHARMACY -  BRIEF ANTIBIOTIC NOTE   Pharmacy has received consult(s) for Vancomycin and Cefepime from an ED provider.  The patient's profile has been reviewed for ht/wt/allergies/indication/available labs.    One time order(s) placed for   Cefepime 2 gram  Vancomycin 1000 mg   Further antibiotics/pharmacy consults should be ordered by admitting physician if indicated.                       Thank you, Sharen Hones, PharmD, BCPS Clinical Pharmacist  02/09/2021  4:48 PM

## 2021-02-09 NOTE — ED Notes (Signed)
Informed Robin RN of bed assignment.

## 2021-02-09 NOTE — ED Notes (Signed)
In and out cath performed for urine specimen.

## 2021-02-09 NOTE — Consult Note (Signed)
CODE SEPSIS - PHARMACY COMMUNICATION  **Broad Spectrum Antibiotics should be administered within 1 hour of Sepsis diagnosis**  Time Code Sepsis Called/Page Received: 1556  Antibiotics Ordered: Vancomycin, Cefepime   Time of 1st antibiotic administration: 1615  Additional action taken by pharmacy: n/a  If necessary, Name of Provider/Nurse Contacted: n/a    Sharen Hones ,PharmD Clinical Pharmacist  02/09/2021  4:47 PM

## 2021-02-09 NOTE — ED Notes (Signed)
First Nurse Note: Pt to ED via [facility] for fall x 1 day ago. Pt is not able to ambulate on her own. Per EMS pt has black emesis on her shoulder. Pt has vomited x 4 today. Pt is A & O.

## 2021-02-09 NOTE — ED Notes (Signed)
Patient transported to CT 
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
## 2021-02-09 NOTE — ED Notes (Signed)
Patient resting on stretcher, eyes closed, RR even and non labored. Vitals stable.

## 2021-02-10 ENCOUNTER — Inpatient Hospital Stay: Payer: Medicare Other | Admitting: Anesthesiology

## 2021-02-10 ENCOUNTER — Inpatient Hospital Stay: Payer: Medicare Other

## 2021-02-10 ENCOUNTER — Encounter
Admission: EM | Disposition: A | Discharge status: Skilled Nursing Facility | Payer: Self-pay | Source: Home / Self Care | Attending: Hospitalist

## 2021-02-10 DIAGNOSIS — N179 Acute kidney failure, unspecified: Secondary | ICD-10-CM | POA: Diagnosis not present

## 2021-02-10 DIAGNOSIS — S72011A Unspecified intracapsular fracture of right femur, initial encounter for closed fracture: Secondary | ICD-10-CM | POA: Diagnosis not present

## 2021-02-10 HISTORY — PX: HIP ARTHROPLASTY: SHX981

## 2021-02-10 LAB — BLOOD CULTURE ID PANEL (REFLEXED) - BCID2

## 2021-02-10 LAB — BASIC METABOLIC PANEL
Anion gap: 11 (ref 5–15)
BUN: 19 mg/dL (ref 8–23)
CO2: 28 mmol/L (ref 22–32)
Calcium: 9.1 mg/dL (ref 8.9–10.3)
Chloride: 100 mmol/L (ref 98–111)
Creatinine, Ser: 0.92 mg/dL (ref 0.44–1.00)
GFR, Estimated: >60 mL/min (ref >60)
Glucose, Bld: 108 mg/dL — ABNORMAL HIGH (ref 70–99)
Potassium: 3.3 mmol/L — ABNORMAL LOW (ref 3.5–5.1)
Sodium: 139 mmol/L (ref 135–145)

## 2021-02-10 LAB — CBC
HCT: 35.7 % — ABNORMAL LOW (ref 36.0–46.0)
Hemoglobin: 12.1 g/dL (ref 12.0–15.0)
MCH: 32.4 pg (ref 26.0–34.0)
MCHC: 33.9 g/dL (ref 30.0–36.0)
MCV: 95.5 fL (ref 80.0–100.0)
Platelets: 343 10*3/uL (ref 150–400)
RBC: 3.74 MIL/uL — ABNORMAL LOW (ref 3.87–5.11)
RDW: 14.7 % (ref 11.5–15.5)
WBC: 15.4 10*3/uL — ABNORMAL HIGH (ref 4.0–10.5)
nRBC: 0 % (ref 0.0–0.2)

## 2021-02-10 LAB — C-REACTIVE PROTEIN: CRP: 9.9 mg/dL — ABNORMAL HIGH (ref <1.0)

## 2021-02-10 LAB — PROCALCITONIN
Procalcitonin: 0.75 ng/mL
Procalcitonin: 0.79 ng/mL

## 2021-02-10 SURGERY — HEMIARTHROPLASTY, HIP, DIRECT ANTERIOR APPROACH, FOR FRACTURE
Anesthesia: General | Site: Hip | Laterality: Right

## 2021-02-10 MED ORDER — CEFAZOLIN SODIUM-DEXTROSE 1-4 GM/50ML-% IV SOLN
INTRAVENOUS | Status: DC | PRN
  Administered 2021-02-10: 1.5 g via INTRAVENOUS

## 2021-02-10 MED ORDER — LACTATED RINGERS IV SOLN: INTRAVENOUS | Status: DC | PRN

## 2021-02-10 MED ORDER — FENTANYL CITRATE (PF) 100 MCG/2ML IJ SOLN
INTRAMUSCULAR | Status: DC | PRN
  Administered 2021-02-10 (×2): 25 ug via INTRAVENOUS
  Administered 2021-02-10: 50 ug via INTRAVENOUS

## 2021-02-10 MED ORDER — NEOMYCIN-POLYMYXIN B GU 40-200000 IR SOLN
Status: DC | PRN
  Administered 2021-02-10: 16 mL

## 2021-02-10 MED ORDER — BUPIVACAINE HCL (PF) 0.5 % IJ SOLN
INTRAMUSCULAR | Status: DC | PRN
  Administered 2021-02-10: 2.2 mL

## 2021-02-10 MED ORDER — INFLUENZA VAC A&B SA ADJ QUAD 0.5 ML IM PRSY
0.5000 mL | PREFILLED_SYRINGE | INTRAMUSCULAR | Status: DC
  Filled 2021-02-10 (×2): qty 0.5

## 2021-02-10 MED ORDER — SERTRALINE HCL 50 MG PO TABS
75.0000 mg | ORAL_TABLET | Freq: Every day | ORAL | Status: DC
  Administered 2021-02-11 – 2021-03-11 (×27): 75 mg via ORAL
  Filled 2021-02-10 (×27): qty 2

## 2021-02-10 MED ORDER — METOPROLOL TARTRATE 50 MG PO TABS
50.0000 mg | ORAL_TABLET | Freq: Two times a day (BID) | ORAL | Status: DC
  Administered 2021-02-10 – 2021-03-11 (×54): 50 mg via ORAL
  Filled 2021-02-10 (×55): qty 1

## 2021-02-10 MED ORDER — PROPOFOL 500 MG/50ML IV EMUL
INTRAVENOUS | Status: DC | PRN
  Administered 2021-02-10: 50 ug/kg/min via INTRAVENOUS

## 2021-02-10 MED ORDER — CEFAZOLIN SODIUM-DEXTROSE 2-4 GM/100ML-% IV SOLN
2.0000 g | Freq: Three times a day (TID) | INTRAVENOUS | Status: DC
  Administered 2021-02-11 – 2021-02-12 (×3): 2 g via INTRAVENOUS
  Filled 2021-02-10 (×8): qty 100

## 2021-02-10 MED ORDER — FENTANYL CITRATE (PF) 100 MCG/2ML IJ SOLN
INTRAMUSCULAR | Status: AC
  Filled 2021-02-10: qty 2

## 2021-02-10 MED ORDER — PROPOFOL 10 MG/ML IV BOLUS
INTRAVENOUS | Status: DC | PRN
  Administered 2021-02-10: 20 mg via INTRAVENOUS
  Administered 2021-02-10: 30 mg via INTRAVENOUS
  Administered 2021-02-10: 20 mg via INTRAVENOUS

## 2021-02-10 MED ORDER — SIMVASTATIN 20 MG PO TABS
40.0000 mg | ORAL_TABLET | Freq: Every day | ORAL | Status: DC
  Administered 2021-02-10: 40 mg via ORAL
  Filled 2021-02-10: qty 2

## 2021-02-10 MED ORDER — ATORVASTATIN CALCIUM 20 MG PO TABS
20.0000 mg | ORAL_TABLET | Freq: Every day | ORAL | Status: DC
  Administered 2021-02-11 – 2021-03-10 (×27): 20 mg via ORAL
  Filled 2021-02-10 (×28): qty 1

## 2021-02-10 SURGICAL SUPPLY — 59 items
BLADE SAGITTAL WIDE XTHICK NO (BLADE) ×2 IMPLANT
BLADE SURG SZ10 CARB STEEL (BLADE) ×2 IMPLANT
BNDG COHESIVE 4X5 TAN STRL (GAUZE/BANDAGES/DRESSINGS) ×2 IMPLANT
CANISTER SUCT 1200ML W/VALVE (MISCELLANEOUS) ×2 IMPLANT
CANISTER SUCT 3000ML PPV (MISCELLANEOUS) ×4 IMPLANT
COVER BACK TABLE REUSABLE LG (DRAPES) ×2 IMPLANT
COVER WAND RF STERILE (DRAPES) ×2 IMPLANT
DRAPE 3/4 80X56 (DRAPES) ×4 IMPLANT
DRAPE IMP U-DRAPE 54X76 (DRAPES) ×2 IMPLANT
DRAPE INCISE IOBAN 66X60 STRL (DRAPES) ×2 IMPLANT
DRAPE SPLIT 6X30 W/TAPE (DRAPES) ×4 IMPLANT
DRAPE SURG 17X11 SM STRL (DRAPES) ×2 IMPLANT
DRSG AQUACEL AG ADV 3.5X10 (GAUZE/BANDAGES/DRESSINGS) ×2 IMPLANT
DRSG OPSITE POSTOP 4X10 (GAUZE/BANDAGES/DRESSINGS) ×2 IMPLANT
DURAPREP 26ML APPLICATOR (WOUND CARE) ×8 IMPLANT
ELECT BLADE 6.5 EXT (BLADE) ×2 IMPLANT
ELECT CAUTERY BLADE 6.4 (BLADE) ×2 IMPLANT
ELECT REM PT RETURN 9FT ADLT (ELECTROSURGICAL) ×2
ELECTRODE REM PT RTRN 9FT ADLT (ELECTROSURGICAL) ×1 IMPLANT
GAUZE SPONGE 4X4 12PLY STRL (GAUZE/BANDAGES/DRESSINGS) ×2 IMPLANT
GAUZE XEROFORM 1X8 LF (GAUZE/BANDAGES/DRESSINGS) ×2 IMPLANT
GLOVE SURG 9.0 ORTHO LTXF (GLOVE) ×4 IMPLANT
GLOVE SURG UNDER POLY LF SZ9 (GLOVE) ×2 IMPLANT
GOWN STRL REUS TWL 2XL XL LVL4 (GOWN DISPOSABLE) ×2 IMPLANT
GOWN STRL REUS W/ TWL LRG LVL3 (GOWN DISPOSABLE) ×1 IMPLANT
GOWN STRL REUS W/TWL LRG LVL3 (GOWN DISPOSABLE) ×1
HEAD MODULAR ENDO (Orthopedic Implant) ×1 IMPLANT
HEAD UNPLR 46XMDLR STRL HIP (Orthopedic Implant) ×1 IMPLANT
HEMOVAC 400ML (MISCELLANEOUS) ×2
HOLDER FOLEY CATH W/STRAP (MISCELLANEOUS) ×2 IMPLANT
KIT DRAIN HEMOVAC JP 7FR 400ML (MISCELLANEOUS) ×1 IMPLANT
KIT TURNOVER KIT A (KITS) ×2 IMPLANT
MANIFOLD NEPTUNE II (INSTRUMENTS) ×2 IMPLANT
NDL SAFETY ECLIPSE 18X1.5 (NEEDLE) ×1 IMPLANT
NEEDLE FILTER BLUNT 18X 1/2SAF (NEEDLE) ×1
NEEDLE FILTER BLUNT 18X1 1/2 (NEEDLE) ×1 IMPLANT
NEEDLE HYPO 18GX1.5 SHARP (NEEDLE) ×1
NEEDLE MAYO CATGUT SZ4 (NEEDLE) ×2 IMPLANT
NS IRRIG 1000ML POUR BTL (IV SOLUTION) ×2 IMPLANT
PACK HIP PROSTHESIS (MISCELLANEOUS) ×2 IMPLANT
PAD ABD DERMACEA PRESS 5X9 (GAUZE/BANDAGES/DRESSINGS) ×2 IMPLANT
PILLOW ABDUCTION FOAM SM (MISCELLANEOUS) ×2 IMPLANT
PULSAVAC PLUS IRRIG FAN TIP (DISPOSABLE) ×4
RETRIEVER SUT HEWSON (MISCELLANEOUS) ×2 IMPLANT
SLEEVE UNITRAX (Orthopedic Implant) ×2 IMPLANT
SOL .9 NS 3000ML IRR  AL (IV SOLUTION) ×1
SOL .9 NS 3000ML IRR UROMATIC (IV SOLUTION) ×1 IMPLANT
STAPLER SKIN PROX 35W (STAPLE) ×2 IMPLANT
STEM ACCOLADE SZ 6 (Hips) ×2 IMPLANT
SUT TICRON 2-0 30IN 311381 (SUTURE) ×8 IMPLANT
SUT VIC AB 0 CT1 36 (SUTURE) ×2 IMPLANT
SUT VIC AB 2-0 CT2 27 (SUTURE) ×4 IMPLANT
SYR 10ML LL (SYRINGE) ×2 IMPLANT
TAPE MICROFOAM 4IN (TAPE) ×2 IMPLANT
TAPE TRANSPORE STRL 2 31045 (GAUZE/BANDAGES/DRESSINGS) ×2 IMPLANT
TIP BRUSH PULSAVAC PLUS 24.33 (MISCELLANEOUS) ×2 IMPLANT
TIP FAN IRRIG PULSAVAC PLUS (DISPOSABLE) ×2 IMPLANT
TRAY FOLEY SLVR 16FR LF STAT (SET/KITS/TRAYS/PACK) ×2 IMPLANT
TUBE SUCT KAM VAC (TUBING) ×2 IMPLANT

## 2021-02-10 NOTE — Progress Notes (Signed)
PHARMACIST - PHYSICIAN COMMUNICATION  CONCERNING:  Simvastatin dose with amlodipine   RECOMMENDATION: Patient was prescribed simvastatin 40 mg qHS (home regimen)  Dose of simvastatin should not exceed 20 mg daily if coadministering with amlodipine due to increased risk of rhabdomyolysis   DESCRIPTION: Pharmacy has substituted equivalent dose of atorvastatin    Patient is now receiving atorvastatin 20 mg q HS while in patient. Consider modifying home regimen at discharge    Sharen Hones, PharmD, BCPS Clinical Pharmacist  02/10/2021 10:26 PM

## 2021-02-10 NOTE — Op Note (Signed)
02/10/2021  6:47 PM  PATIENT:  Melanie White   MRN: [id number]  PRE-OPERATIVE DIAGNOSIS:  Right displaced femoral neck hip fracture  POST-OPERATIVE DIAGNOSIS:  Right displaced femoral neck hip Fracture  PROCEDURE:  Procedure(s): RIGHT HIP HEMIARTHROPLASTY  PREOPERATIVE INDICATIONS:    Melanie White is an 75 y.o. female who was admitted with a diagnosis of Right displaced femoral neck hip Fracture.  I have recommended surgical treatment with right hip hemiarthroplasty for this injury. I have explained the surgery and the postoperative course to the patient who agreed with surgical management of this fracture.    The risks benefits and alternatives were discussed with the patient and their family including but not limited to the risks of  infection requiring removal of the prosthesis, bleeding requiring blood transfusion, nerve injury especially to the sciatic nerve leading to foot drop or lower extremity numbness, periprosthetic fracture, dislocation leg length discrepancy, change in lower extremity rotation persistent hip pain, loosening or failure of the components and the need for revision surgery. Medical risks include but are not limited to DVT and pulmonary embolism, myocardial infarction, stroke, pneumonia, respiratory failure and death.  OPERATIVE REPORT     SURGEON:  Juanell Fairly, MD    ASSISTANT: Surgical tech    ANESTHESIA: Spinal  EBL: 100 cc    COMPLICATIONS:  None.   SPECIMEN: Femoral head to pathology    COMPONENTS:  Stryker Accolade 2 femoral component size 6 with a Stryker Unitrax 46 mm -4 neck adjustment sleeve.    PROCEDURE IN DETAIL:   Given that the patient is Covid positive she was brought from her hospital room directly to the OR where she underwent spinal anesthesia.  The patient was then placed in the lateral decubitus position with the right hip position upward and secured on the operating room table with a pegboard and all bony prominences were  adequately padded. This included an axillary roll and additional padding around the nonoperative leg to prevent compression to the common peroneal nerve.    The operative lower extremity was prepped and draped in a sterile fashion.  A time out was performed prior to incision to verify patient's name, date of birth, medical record number, correct site of surgery correct procedure to be performed. The timeout was also used to verify the patient received antibiotics now appropriate instruments, implants and radiographic studies were available in the room. Once all in attendance were in agreement case began.    A posterolateral approach was utilized via sharp dissection  carried down to the subcutaneous tissue.  Bleeding vessels were coagulated using electrocautery.  The fascia lata was identified and incised along the length of the skin incision.  The gluteus maximus muscle was then split in line with its fibers. Self-retaining retractors were  inserted.  With the hip internally rotated, the short external rotators  were identified and removed from the posterior attachment from the greater trochanter. The piriformis was tagged for later repair. The capsule was identified and a T-shaped capsulotomy was performed. The capsule was tagged with #2 Tycron for later repair.  The femoral neck fracture was exposed, and the femoral head was removed using a corkscrew device. This was measured to be 46 mm in diameter. The attention was then turned to proximal femur preparation.  An oscillating saw was used to perform a proximal femoral osteotomy 1 fingerbreadth above the lesser trochanter. The trial 46 mm femoral head was placed into the acetabulum and had an excellent suction fit. The attention  was then turned back to femoral preparation.    A femoral skid and Cobra retractor were placed under the femoral neck to allow for adequate visualization. A box osteotome was used to make the initial entry into the proximal femur.  A single hand reamer was used to prepare the femoral canal. A T-shaped femoral canal sounder was then used to ensure no penetration femoral cortex had occurred during reaming. The proximal femur was then sequentially broached by hand. A size 6 femoral trial broach was found to have best medial to lateral canal fit. Once adequate mediolateral canal fill was achieved the trial size 6 femoral broach, -4 neck, and 46 mm head was assembled and the hip was reduced. It was found to have excellent stability, equivalent leg lengths with functional range of motion. The trial components were then removed.  I copiously irrigated the femoral canal and then impacted the real size 6 femoral prosthesis into place into the appropriate version, slightly anteverted to the normal anatomy, and I impacted the actual 46 mm Unitrax femoral component with a -4 neck adjustment sleeve into place. The hip was then reduced and taken through functional range of motion and found to have excellent stability. Leg lengths were restored. The hip joint was copiously irrigated.   A soft tissue repair of the capsule and external rotators was performed using #2 Tycron Excellent posterior capsular repair was achieved. The fascia lata was then closed with interrupted 0 Vicryl suture. The subcutaneous tissues were closed with 2-0 Vicryl and the skin approximated with staples.   The patient was then placed supine on the operative table. Leg lengths were checked clinically and found to be equivalent. An abduction pillow was placed between the lower extremities. The patient was then transferred to a hospital bed and brought to the PACU in stable condition. I was scrubbed and present the entire case and all sharp and instrument counts were correct at the conclusion of the case.   Kathreen Devoid, MD Orthopedic Surgeon

## 2021-02-10 NOTE — Anesthesia Procedure Notes (Signed)
Spinal  Patient location during procedure: OR Start time: 02/10/2021 4:45 PM Staffing Performed: anesthesiologist  Anesthesiologist: Yevette Edwards, MD Preanesthetic Checklist Completed: patient identified, IV checked, site marked, risks and benefits discussed, surgical consent, monitors and equipment checked, pre-op evaluation and timeout performed Spinal Block Patient position: left lateral decubitus Prep: DuraPrep Patient monitoring: heart rate, cardiac monitor, continuous pulse ox and blood pressure Approach: midline Location: L3-4 Injection technique: single-shot Needle Needle type: Whitacre  Needle gauge: 22 G Needle length: 9 cm Assessment Sensory level: T4 Additional Notes Attempts x2 per Dr. Pernell Dupre w/ + csf on second attempt, - heme, - parathesia, pt tolerate well

## 2021-02-10 NOTE — Anesthesia Preprocedure Evaluation (Signed)
Anesthesia Evaluation  Patient identified by MRN, date of birth, ID band Patient awake    Reviewed: Allergy & Precautions, H&P , NPO status , Patient's Chart, lab work & pertinent test results, reviewed documented beta blocker date and time   Airway Mallampati: II   Neck ROM: full    Dental  (+) Poor Dentition   Pulmonary neg pulmonary ROS, neg shortness of breath,  covid positive   Pulmonary exam normal        Cardiovascular Exercise Tolerance: Poor hypertension, On Medications negative cardio ROS Normal cardiovascular exam Rhythm:regular Rate:Normal     Neuro/Psych PSYCHIATRIC DISORDERS Anxiety Depression negative neurological ROS  negative psych ROS   GI/Hepatic negative GI ROS, Neg liver ROS, GERD  Medicated,  Endo/Other  negative endocrine ROS  Renal/GU Renal diseasenegative Renal ROS  negative genitourinary   Musculoskeletal   Abdominal   Peds  Hematology negative hematology ROS (+)   Anesthesia Other Findings Past Medical History: No date: Anxiety No date: Depression No date: Hyperlipidemia No date: Hypertension Past Surgical History: No date: ABDOMINAL HYSTERECTOMY     Comment:  Partial Hysterectomy No date: TONSILLECTOMY BMI    Body Mass Index: 18.14 kg/m     Reproductive/Obstetrics negative OB ROS                             Anesthesia Physical Anesthesia Plan  ASA: III and emergent  Anesthesia Plan: General   Post-op Pain Management:    Induction:   PONV Risk Score and Plan:   Airway Management Planned:   Additional Equipment:   Intra-op Plan:   Post-operative Plan:   Informed Consent: I have reviewed the patients History and Physical, chart, labs and discussed the procedure including the risks, benefits and alternatives for the proposed anesthesia with the patient or authorized representative who has indicated his/her understanding and acceptance.      Dental Advisory Given  Plan Discussed with: CRNA  Anesthesia Plan Comments:         Anesthesia Quick Evaluation
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
## 2021-02-10 NOTE — Transfer of Care (Signed)
Immediate Anesthesia Transfer of Care Note  Patient: Melanie White  Procedure(s) Performed: ARTHROPLASTY BIPOLAR HIP (HEMIARTHROPLASTY) (Right Hip)  Patient Location: Floor  Anesthesia Type:Spinal  Level of Consciousness: awake, alert  and oriented  Airway & Oxygen Therapy: Patient Spontanous Breathing and Patient connected to nasal cannula oxygen  Post-op Assessment: Report given to RN and Post -op Vital signs reviewed and stable  Post vital signs: Reviewed and stable  Last Vitals:  Vitals Value Taken Time  BP 142/78 02/10/21 1935  Temp 36.6 C 02/10/21 1935  Pulse 100 02/10/21 1935  Resp 16 02/10/21 1935  SpO2 100 % 02/10/21 1935    Last Pain:  Vitals:   02/10/21 1935  TempSrc: Oral  PainSc:       Patients Stated Pain Goal: 4 (02/10/21 0715)  Complications: No complications documented.

## 2021-02-10 NOTE — Progress Notes (Signed)
PHARMACY - PHYSICIAN COMMUNICATION CRITICAL VALUE ALERT - BLOOD CULTURE IDENTIFICATION (BCID)  Melanie White is an 75 y.o. female who presented to [facility] on 02/09/2021 with a chief complaint of right leg pain pain with difficulty ambulating, vomiting and diarrhea with Covid exposure.  Assessment:  1/4 bottles Staph epidermidis no resistance detected. Could be contaminant however pt presented with signs of possible sepsis with elevated WBC, tachycardic and elevated lactic acid although this is now trending down.   Name of physician (or Provider) Contacted: Dr. Fran Lowes  Current antibiotics: pt received one time doses of vancomycin and cefazolin   Changes to prescribed antibiotics recommended:  Continue Cefazolin 2g IV q8h  Results for orders placed or performed during the hospital encounter of 02/09/21  Blood Culture ID Panel (Reflexed) (Collected: 02/09/2021  3:55 PM)  Result Value Ref Range   Enterococcus faecalis NOT DETECTED NOT DETECTED   Enterococcus Faecium NOT DETECTED NOT DETECTED   Listeria monocytogenes NOT DETECTED NOT DETECTED   Staphylococcus species DETECTED (A) NOT DETECTED   Staphylococcus aureus (BCID) NOT DETECTED NOT DETECTED   Staphylococcus epidermidis DETECTED (A) NOT DETECTED   Staphylococcus lugdunensis NOT DETECTED NOT DETECTED   Streptococcus species NOT DETECTED NOT DETECTED   Streptococcus agalactiae NOT DETECTED NOT DETECTED   Streptococcus pneumoniae NOT DETECTED NOT DETECTED   Streptococcus pyogenes NOT DETECTED NOT DETECTED   A.calcoaceticus-baumannii NOT DETECTED NOT DETECTED   Bacteroides fragilis NOT DETECTED NOT DETECTED   Enterobacterales NOT DETECTED NOT DETECTED   Enterobacter cloacae complex NOT DETECTED NOT DETECTED   Escherichia coli NOT DETECTED NOT DETECTED   Klebsiella aerogenes NOT DETECTED NOT DETECTED   Klebsiella oxytoca NOT DETECTED NOT DETECTED   Klebsiella pneumoniae NOT DETECTED NOT DETECTED   Proteus species NOT DETECTED NOT  DETECTED   Salmonella species NOT DETECTED NOT DETECTED   Serratia marcescens NOT DETECTED NOT DETECTED   Haemophilus influenzae NOT DETECTED NOT DETECTED   Neisseria meningitidis NOT DETECTED NOT DETECTED   Pseudomonas aeruginosa NOT DETECTED NOT DETECTED   Stenotrophomonas maltophilia NOT DETECTED NOT DETECTED   Candida albicans NOT DETECTED NOT DETECTED   Candida auris NOT DETECTED NOT DETECTED   Candida glabrata NOT DETECTED NOT DETECTED   Candida krusei NOT DETECTED NOT DETECTED   Candida parapsilosis NOT DETECTED NOT DETECTED   Candida tropicalis NOT DETECTED NOT DETECTED   Cryptococcus neoformans/gattii NOT DETECTED NOT DETECTED   Methicillin resistance mecA/C NOT DETECTED NOT DETECTED    Raiford Noble, PharmD Pharmacy Resident  02/10/2021 5:28 PM

## 2021-02-11 DIAGNOSIS — S72011A Unspecified intracapsular fracture of right femur, initial encounter for closed fracture: Secondary | ICD-10-CM | POA: Diagnosis not present

## 2021-02-11 DIAGNOSIS — U071 COVID-19: Secondary | ICD-10-CM

## 2021-02-11 DIAGNOSIS — E43 Unspecified severe protein-calorie malnutrition: Secondary | ICD-10-CM | POA: Insufficient documentation

## 2021-02-11 DIAGNOSIS — L8990 Pressure ulcer of unspecified site, unspecified stage: Secondary | ICD-10-CM | POA: Insufficient documentation

## 2021-02-11 DIAGNOSIS — A0839 Other viral enteritis: Secondary | ICD-10-CM

## 2021-02-11 DIAGNOSIS — N179 Acute kidney failure, unspecified: Secondary | ICD-10-CM | POA: Diagnosis not present

## 2021-02-11 LAB — BASIC METABOLIC PANEL
Anion gap: 10 (ref 5–15)
BUN: 17 mg/dL (ref 8–23)
CO2: 27 mmol/L (ref 22–32)
Calcium: 8.1 mg/dL — ABNORMAL LOW (ref 8.9–10.3)
Chloride: 100 mmol/L (ref 98–111)
Creatinine, Ser: 0.8 mg/dL (ref 0.44–1.00)
GFR, Estimated: >60 mL/min (ref >60)
Glucose, Bld: 98 mg/dL (ref 70–99)
Potassium: 3.4 mmol/L — ABNORMAL LOW (ref 3.5–5.1)
Sodium: 137 mmol/L (ref 135–145)

## 2021-02-11 LAB — CBC
HCT: 30.8 % — ABNORMAL LOW (ref 36.0–46.0)
Hemoglobin: 10.1 g/dL — ABNORMAL LOW (ref 12.0–15.0)
MCH: 31.9 pg (ref 26.0–34.0)
MCHC: 32.8 g/dL (ref 30.0–36.0)
MCV: 97.2 fL (ref 80.0–100.0)
Platelets: 274 10*3/uL (ref 150–400)
RBC: 3.17 MIL/uL — ABNORMAL LOW (ref 3.87–5.11)
RDW: 14.6 % (ref 11.5–15.5)
WBC: 11 10*3/uL — ABNORMAL HIGH (ref 4.0–10.5)
nRBC: 0 % (ref 0.0–0.2)

## 2021-02-11 LAB — URINE CULTURE: Culture: <10000 — AB

## 2021-02-11 LAB — MAGNESIUM: Magnesium: 1.5 mg/dL — ABNORMAL LOW (ref 1.7–2.4)

## 2021-02-11 LAB — PROCALCITONIN: Procalcitonin: 0.54 ng/mL

## 2021-02-11 MED ORDER — POLYETHYLENE GLYCOL 3350 17 G PO PACK
17.0000 g | PACK | Freq: Every day | ORAL | Status: DC | PRN
  Administered 2021-02-11: 17 g via ORAL
  Filled 2021-02-11: qty 1

## 2021-02-11 MED ORDER — CALCIUM CARBONATE ANTACID 500 MG PO CHEW
1.0000 | CHEWABLE_TABLET | Freq: Three times a day (TID) | ORAL | Status: DC | PRN
  Administered 2021-02-11 – 2021-02-15 (×3): 200 mg via ORAL
  Filled 2021-02-11 (×3): qty 1

## 2021-02-11 MED ORDER — CEFAZOLIN SODIUM-DEXTROSE 1-4 GM/50ML-% IV SOLN
1.0000 g | Freq: Four times a day (QID) | INTRAVENOUS | Status: DC
  Filled 2021-02-11 (×2): qty 50

## 2021-02-11 MED ORDER — ALUM & MAG HYDROXIDE-SIMETH 200-200-20 MG/5ML PO SUSP
30.0000 mL | ORAL | Status: DC | PRN
  Administered 2021-02-12 – 2021-02-15 (×3): 30 mL via ORAL
  Filled 2021-02-11 (×3): qty 30

## 2021-02-11 MED ORDER — ALUM & MAG HYDROXIDE-SIMETH 200-200-20 MG/5ML PO SUSP
30.0000 mL | Freq: Once | ORAL | Status: AC
  Administered 2021-02-11: 30 mL via ORAL
  Filled 2021-02-11: qty 30

## 2021-02-11 MED ORDER — MAGNESIUM SULFATE 2 GM/50ML IV SOLN
2.0000 g | Freq: Once | INTRAVENOUS | Status: AC
  Administered 2021-02-11: 2 g via INTRAVENOUS
  Filled 2021-02-11: qty 50

## 2021-02-11 MED ORDER — SENNA 8.6 MG PO TABS
1.0000 | ORAL_TABLET | Freq: Two times a day (BID) | ORAL | Status: DC
  Administered 2021-02-11 – 2021-02-16 (×9): 8.6 mg via ORAL
  Filled 2021-02-11 (×9): qty 1

## 2021-02-11 MED ORDER — SODIUM CHLORIDE 0.9 % IV SOLN
INTRAVENOUS | Status: DC | PRN
  Administered 2021-02-11 – 2021-02-24 (×2): 250 mL via INTRAVENOUS
  Administered 2021-03-05: 1000 mL via INTRAVENOUS

## 2021-02-11 MED ORDER — DOCUSATE SODIUM 100 MG PO CAPS
100.0000 mg | ORAL_CAPSULE | Freq: Two times a day (BID) | ORAL | Status: DC
  Administered 2021-02-11 – 2021-02-16 (×9): 100 mg via ORAL
  Filled 2021-02-11 (×9): qty 1

## 2021-02-11 MED ORDER — ADULT MULTIVITAMIN W/MINERALS CH
1.0000 | ORAL_TABLET | Freq: Every day | ORAL | Status: DC
  Administered 2021-02-12 – 2021-03-11 (×26): 1 via ORAL
  Filled 2021-02-11 (×26): qty 1

## 2021-02-11 MED ORDER — ACETAMINOPHEN 325 MG PO TABS
325.0000 mg | ORAL_TABLET | Freq: Four times a day (QID) | ORAL | Status: DC | PRN
  Administered 2021-03-03 – 2021-03-10 (×3): 650 mg via ORAL
  Filled 2021-02-11 (×4): qty 2

## 2021-02-11 MED ORDER — METHOCARBAMOL 1000 MG/10ML IJ SOLN
500.0000 mg | Freq: Four times a day (QID) | INTRAVENOUS | Status: DC | PRN
  Filled 2021-02-11: qty 5

## 2021-02-11 MED ORDER — BISACODYL 10 MG RE SUPP: 10.0000 mg | Freq: Every day | RECTAL | Status: DC | PRN

## 2021-02-11 MED ORDER — POTASSIUM CHLORIDE CRYS ER 20 MEQ PO TBCR
40.0000 meq | EXTENDED_RELEASE_TABLET | Freq: Once | ORAL | Status: AC
  Administered 2021-02-11: 40 meq via ORAL
  Filled 2021-02-11: qty 2

## 2021-02-11 MED ORDER — MAGNESIUM CITRATE PO SOLN
1.0000 | Freq: Once | ORAL | Status: DC | PRN
  Filled 2021-02-11: qty 296

## 2021-02-11 MED ORDER — TRAMADOL HCL 50 MG PO TABS
50.0000 mg | ORAL_TABLET | Freq: Four times a day (QID) | ORAL | Status: DC
  Administered 2021-02-11 – 2021-02-25 (×39): 50 mg via ORAL
  Filled 2021-02-11 (×41): qty 1

## 2021-02-11 MED ORDER — ONDANSETRON HCL 4 MG PO TABS
4.0000 mg | ORAL_TABLET | Freq: Four times a day (QID) | ORAL | Status: DC | PRN
  Administered 2021-02-14 – 2021-02-21 (×3): 4 mg via ORAL
  Filled 2021-02-11 (×3): qty 1

## 2021-02-11 MED ORDER — HYDROCODONE-ACETAMINOPHEN 5-325 MG PO TABS
1.0000 | ORAL_TABLET | ORAL | Status: DC | PRN
  Administered 2021-02-11 – 2021-02-13 (×4): 1 via ORAL
  Administered 2021-02-14 – 2021-02-17 (×4): 2 via ORAL
  Administered 2021-02-19 – 2021-02-21 (×3): 1 via ORAL
  Administered 2021-02-24: 2 via ORAL
  Filled 2021-02-11: qty 1
  Filled 2021-02-11: qty 2
  Filled 2021-02-11 (×2): qty 1
  Filled 2021-02-11: qty 2
  Filled 2021-02-11 (×2): qty 1
  Filled 2021-02-11 (×4): qty 2
  Filled 2021-02-11: qty 1

## 2021-02-11 MED ORDER — ENOXAPARIN SODIUM 30 MG/0.3ML ~~LOC~~ SOLN
30.0000 mg | Freq: Every day | SUBCUTANEOUS | Status: DC
  Administered 2021-02-11 – 2021-02-13 (×3): 30 mg via SUBCUTANEOUS
  Filled 2021-02-11 (×3): qty 0.3

## 2021-02-11 MED ORDER — ENSURE ENLIVE PO LIQD
237.0000 mL | Freq: Two times a day (BID) | ORAL | Status: DC
  Administered 2021-02-11 – 2021-02-16 (×9): 237 mL via ORAL

## 2021-02-11 MED ORDER — METHOCARBAMOL 500 MG PO TABS
500.0000 mg | ORAL_TABLET | Freq: Four times a day (QID) | ORAL | Status: DC | PRN
  Administered 2021-02-11 – 2021-02-20 (×3): 500 mg via ORAL
  Filled 2021-02-11 (×5): qty 1

## 2021-02-11 MED ORDER — MORPHINE SULFATE (PF) 2 MG/ML IV SOLN: 0.5000 mg | INTRAVENOUS | Status: DC | PRN

## 2021-02-11 MED ORDER — SODIUM CHLORIDE 0.9 % IV SOLN: INTRAVENOUS | Status: AC

## 2021-02-11 MED ORDER — CHLORHEXIDINE GLUCONATE CLOTH 2 % EX PADS
6.0000 | MEDICATED_PAD | Freq: Every day | CUTANEOUS | Status: DC
  Administered 2021-02-11 – 2021-03-07 (×20): 6 via TOPICAL

## 2021-02-11 MED ORDER — ONDANSETRON HCL 4 MG/2ML IJ SOLN
4.0000 mg | Freq: Four times a day (QID) | INTRAMUSCULAR | Status: DC | PRN
  Administered 2021-02-11 – 2021-03-05 (×4): 4 mg via INTRAVENOUS
  Filled 2021-02-11 (×4): qty 2

## 2021-02-11 NOTE — Progress Notes (Signed)
Initial Nutrition Assessment  DOCUMENTATION CODES:   Severe malnutrition in context of chronic illness  INTERVENTION:  Continue Ensure Enlive po BID, each supplement provides 350 kcal and 20 grams of protein.  Provide Magic cup TID with meals, each supplement provides 290 kcal and 9 grams of protein.  Provide MVI po daily.  Monitor magnesium, potassium, and phosphorus daily for at least 3 days, MD to replete as needed, as pt is at risk for refeeding syndrome.  NUTRITION DIAGNOSIS:   Severe Malnutrition related to social / environmental circumstances (suspected inadequate oral intake) as evidenced by severe fat depletion,severe muscle depletion.  GOAL:   Patient will meet greater than or equal to 90% of their needs  MONITOR:   Supplement acceptance,Labs,Weight trends,PO intake,Skin,I & O's  REASON FOR ASSESSMENT:   Consult Assessment of nutrition requirement/status  ASSESSMENT:   75 year old female with PMHx of anxiety, depression, HTN, HLD admitted with closed displaced fracture of right femoral neck s/p right hip hemiarthroplasty 2/20, also with gastroenteritis, COVID-19 infection, AKI.   Met with patient at bedside. She reports she has had a decreased appetite on and off for a while now. She typically tries to eat 2-3 meals per day plus snacks between meals. She may have chicken, pork, or beef with mashed potatoes but does report her portion sizes are small. RD suspects patient with inadequate oral intake at baseline. Patient reports she is experiencing nausea. She reports she only ate bites of her meals today. She was able to drink an Ensure. Patient initially reported she is unsure she could drink more than one Ensure per day. Discussed increased nutrient needs and importance of adequate intake. She is amenable to trying more Ensure and also [employer]. Noted patient was just ordered for Ensure Enlive po BID - agree with this.  Patient reports her UBW was 150 lbs and she lost  down to 122 lbs over time. Per chart patient currently 49.4 kg (109 lbs) so unsure when further weight loss occurred.   Medications reviewed and include: Colace 100 mg BID, senna 1 tablet BID, cefazolin.  Labs reviewed: Potassium 3.4, Magnesium 1.5.  NUTRITION - FOCUSED PHYSICAL EXAM:  Flowsheet Row Most Recent Value  Orbital Region Severe depletion  Upper Arm Region Severe depletion  Thoracic and Lumbar Region Moderate depletion  Buccal Region Severe depletion  Temple Region Severe depletion  Clavicle Bone Region Severe depletion  Clavicle and Acromion Bone Region Severe depletion  Scapular Bone Region Moderate depletion  Dorsal Hand Severe depletion  Patellar Region Severe depletion  Anterior Thigh Region Severe depletion  Posterior Calf Region Severe depletion  Edema (RD Assessment) None  Hair Reviewed  Eyes Reviewed  Mouth Reviewed  Skin Reviewed  Nails Reviewed     Diet Order:   Diet Order            Diet regular Room service appropriate? Yes; Fluid consistency: Thin  Diet effective now                EDUCATION NEEDS:   No education needs have been identified at this time  Skin:  Skin Assessment: Skin Integrity Issues: Skin Integrity Issues:: Stage I,Incisions Stage I: sacrum Incisions: closed incision to right hip  Last BM:  Unknown  Height:   Ht Readings from Last 1 Encounters:  02/09/21 _0  (1.651 m)   Weight:   Wt Readings from Last 1 Encounters:  02/10/21 49.4 kg   BMI:  Body mass index is 18.14 kg/m.  Estimated  Nutritional Needs:   Kcal:  1500-1700  Protein:  75-85 grams  Fluid:  1.2-1.5 L/day  Jacklynn Barnacle, MS, RD, LDN Pager number available on Amion

## 2021-02-11 NOTE — Progress Notes (Signed)
Pt's IV's kept infiltrating. Dr. Fran Lowes notified along the way today but pt's abx and magnesium were behind. Pt remained asymptomatic however. IV team nurse was able to get a working IV around 1500 this evening and IV fluids were started.
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
## 2021-02-12 ENCOUNTER — Encounter: Payer: Self-pay | Admitting: Orthopedic Surgery

## 2021-02-12 DIAGNOSIS — S72011A Unspecified intracapsular fracture of right femur, initial encounter for closed fracture: Secondary | ICD-10-CM | POA: Diagnosis not present

## 2021-02-12 DIAGNOSIS — N179 Acute kidney failure, unspecified: Secondary | ICD-10-CM | POA: Diagnosis not present

## 2021-02-12 LAB — CULTURE, BLOOD (ROUTINE X 2): Special Requests: ADEQUATE

## 2021-02-12 LAB — CBC
HCT: 29.8 % — ABNORMAL LOW (ref 36.0–46.0)
Hemoglobin: 9.5 g/dL — ABNORMAL LOW (ref 12.0–15.0)
MCH: 31.4 pg (ref 26.0–34.0)
MCHC: 31.9 g/dL (ref 30.0–36.0)
MCV: 98.3 fL (ref 80.0–100.0)
Platelets: 269 10*3/uL (ref 150–400)
RBC: 3.03 MIL/uL — ABNORMAL LOW (ref 3.87–5.11)
RDW: 14.5 % (ref 11.5–15.5)
WBC: 11.5 10*3/uL — ABNORMAL HIGH (ref 4.0–10.5)
nRBC: 0 % (ref 0.0–0.2)

## 2021-02-12 LAB — BASIC METABOLIC PANEL
Anion gap: 7 (ref 5–15)
BUN: 21 mg/dL (ref 8–23)
CO2: 28 mmol/L (ref 22–32)
Calcium: 8.1 mg/dL — ABNORMAL LOW (ref 8.9–10.3)
Chloride: 100 mmol/L (ref 98–111)
Creatinine, Ser: 0.89 mg/dL (ref 0.44–1.00)
GFR, Estimated: >60 mL/min (ref >60)
Glucose, Bld: 100 mg/dL — ABNORMAL HIGH (ref 70–99)
Potassium: 4.4 mmol/L (ref 3.5–5.1)
Sodium: 135 mmol/L (ref 135–145)

## 2021-02-12 LAB — SURGICAL PATHOLOGY

## 2021-02-12 LAB — MAGNESIUM: Magnesium: 2.4 mg/dL (ref 1.7–2.4)
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
## 2021-02-12 NOTE — Plan of Care (Signed)
  Problem: Education: Goal: Verbalization of understanding the information provided (i.e., activity precautions, restrictions, etc) will improve Outcome: Progressing   Problem: Activity: Goal: Ability to ambulate and perform ADLs will improve Outcome: Progressing   Problem: Clinical Measurements: Goal: Postoperative complications will be avoided or minimized Outcome: Progressing   Problem: Self-Concept: Goal: Ability to maintain and perform role responsibilities to the fullest extent possible will improve Outcome: Progressing   Problem: Pain Management: Goal: Pain level will decrease Outcome: Progressing   Problem: Education: Goal: Knowledge of General Education information will improve Description: Including pain rating scale, medication(s)/side effects and non-pharmacologic comfort measures Outcome: Progressing   Problem: Health Behavior/Discharge Planning: Goal: Ability to manage health-related needs will improve Outcome: Progressing   Problem: Clinical Measurements: Goal: Ability to maintain clinical measurements within normal limits will improve Outcome: Progressing Goal: Will remain free from infection Outcome: Progressing Goal: Diagnostic test results will improve Outcome: Progressing Goal: Respiratory complications will improve Outcome: Progressing Goal: Cardiovascular complication will be avoided Outcome: Progressing   Problem: Activity: Goal: Risk for activity intolerance will decrease Outcome: Progressing   Problem: Nutrition: Goal: Adequate nutrition will be maintained Outcome: Progressing   Problem: Coping: Goal: Level of anxiety will decrease Outcome: Progressing   Problem: Elimination: Goal: Will not experience complications related to bowel motility Outcome: Progressing Goal: Will not experience complications related to urinary retention Outcome: Progressing   Problem: Pain Managment: Goal: General experience of comfort will  improve Outcome: Progressing   Problem: Safety: Goal: Ability to remain free from injury will improve Outcome: Progressing   Problem: Skin Integrity: Goal: Risk for impaired skin integrity will decrease Outcome: Progressing   Problem: Education: Goal: Knowledge of risk factors and measures for prevention of condition will improve Outcome: Progressing
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
## 2021-02-13 DIAGNOSIS — N179 Acute kidney failure, unspecified: Secondary | ICD-10-CM | POA: Diagnosis not present

## 2021-02-13 DIAGNOSIS — J9601 Acute respiratory failure with hypoxia: Secondary | ICD-10-CM

## 2021-02-13 DIAGNOSIS — S72011A Unspecified intracapsular fracture of right femur, initial encounter for closed fracture: Secondary | ICD-10-CM | POA: Diagnosis not present

## 2021-02-13 LAB — BASIC METABOLIC PANEL
Anion gap: 7 (ref 5–15)
BUN: 23 mg/dL (ref 8–23)
CO2: 30 mmol/L (ref 22–32)
Calcium: 8 mg/dL — ABNORMAL LOW (ref 8.9–10.3)
Chloride: 98 mmol/L (ref 98–111)
Creatinine, Ser: 0.68 mg/dL (ref 0.44–1.00)
GFR, Estimated: >60 mL/min (ref >60)
Glucose, Bld: 86 mg/dL (ref 70–99)
Potassium: 4.5 mmol/L (ref 3.5–5.1)
Sodium: 135 mmol/L (ref 135–145)

## 2021-02-13 LAB — CBC
HCT: 28.7 % — ABNORMAL LOW (ref 36.0–46.0)
Hemoglobin: 9.3 g/dL — ABNORMAL LOW (ref 12.0–15.0)
MCH: 31.7 pg (ref 26.0–34.0)
MCHC: 32.4 g/dL (ref 30.0–36.0)
MCV: 98 fL (ref 80.0–100.0)
Platelets: 297 10*3/uL (ref 150–400)
RBC: 2.93 MIL/uL — ABNORMAL LOW (ref 3.87–5.11)
RDW: 14.4 % (ref 11.5–15.5)
WBC: 10.8 10*3/uL — ABNORMAL HIGH (ref 4.0–10.5)
nRBC: 0 % (ref 0.0–0.2)

## 2021-02-13 LAB — PROCALCITONIN: Procalcitonin: 0.36 ng/mL

## 2021-02-13 LAB — MAGNESIUM: Magnesium: 2.4 mg/dL (ref 1.7–2.4)

## 2021-02-13 MED ORDER — DEXAMETHASONE SODIUM PHOSPHATE 10 MG/ML IJ SOLN
6.0000 mg | Freq: Every day | INTRAMUSCULAR | Status: DC
  Administered 2021-02-13 – 2021-02-14 (×2): 6 mg via INTRAVENOUS
  Filled 2021-02-13 (×2): qty 0.6

## 2021-02-13 MED ORDER — ENOXAPARIN SODIUM 40 MG/0.4ML ~~LOC~~ SOLN
40.0000 mg | Freq: Every day | SUBCUTANEOUS | Status: DC
  Administered 2021-02-14 – 2021-02-20 (×5): 40 mg via SUBCUTANEOUS
  Filled 2021-02-13 (×7): qty 0.4

## 2021-02-13 MED ORDER — SODIUM CHLORIDE 0.9 % IV SOLN
100.0000 mg | Freq: Every day | INTRAVENOUS | Status: DC
  Filled 2021-02-13: qty 20

## 2021-02-13 MED ORDER — SODIUM CHLORIDE 0.9 % IV SOLN
200.0000 mg | Freq: Once | INTRAVENOUS | Status: AC
  Administered 2021-02-13: 200 mg via INTRAVENOUS
  Filled 2021-02-13: qty 200

## 2021-02-13 MED ORDER — SODIUM CHLORIDE 0.9 % IV SOLN
200.0000 mg | Freq: Once | INTRAVENOUS | Status: DC
  Filled 2021-02-13: qty 40

## 2021-02-13 MED ORDER — SODIUM CHLORIDE 0.9 % IV SOLN
100.0000 mg | Freq: Every day | INTRAVENOUS | Status: AC
  Administered 2021-02-15 – 2021-02-17 (×3): 100 mg via INTRAVENOUS
  Filled 2021-02-13 (×3): qty 100
  Filled 2021-02-13 (×2): qty 20
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
## 2021-02-13 NOTE — Progress Notes (Signed)
PHARMACIST - PHYSICIAN COMMUNICATION  CONCERNING:  Enoxaparin (Lovenox) for DVT Prophylaxis    RECOMMENDATION: Patient was prescribed enoxaprin 30mg  q24 hours for VTE prophylaxis.   Filed Weights   02/09/21 1341 02/10/21 0304  Weight: 55.3 kg (122 lb) 49.4 kg (109 lb)    Body mass index is 18.14 kg/m.  Estimated Creatinine Clearance: 47.4 mL/min (by C-G formula based on SCr of 0.68 mg/dL).   Patient is candidate for enoxaparin 40mg  every 24 hours based on CrCl >31ml/min and Weight >45kg  DESCRIPTION: Pharmacy has adjusted enoxaparin dose per [facility] policy.  Patient is now receiving enoxaparin 40 mg every 24 hours    31m, PharmD, BCPS Clinical Pharmacist 02/13/2021 11:24 AM
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
## 2021-02-13 NOTE — Consult Note (Signed)
Remdesivir - Pharmacy Brief Note   O:  ALT: 21 CXR: Mild interstitial prominence - no documented PNA SpO2: 87% on RA   A/P:  Remdesivir 200 mg IVPB once followed by 100 mg IVPB daily x 4 days.   Albina Billet, PharmD, BCPS Clinical Pharmacist 02/13/2021 1:16 PM
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
## 2021-02-13 NOTE — Clinical Social Work Note (Addendum)
Patient on COVID isolation precautions. Attempted calling patient in room to discuss SNF recommendation. No answer. Will try again later.  Charlynn Court, CSW [phone]  2:44 pm: Tried calling patient in room again. No answer.  Charlynn Court, CSW [phone]

## 2021-02-13 NOTE — Plan of Care (Signed)
  Problem: Education: Goal: Verbalization of understanding the information provided (i.e., activity precautions, restrictions, etc) will improve Outcome: Progressing Goal: Individualized Educational Video(s) Outcome: Progressing   Problem: Activity: Goal: Ability to ambulate and perform ADLs will improve Outcome: Progressing   Problem: Clinical Measurements: Goal: Postoperative complications will be avoided or minimized Outcome: Progressing   Problem: Self-Concept: Goal: Ability to maintain and perform role responsibilities to the fullest extent possible will improve Outcome: Progressing   Problem: Pain Management: Goal: Pain level will decrease Outcome: Progressing   Problem: Education: Goal: Knowledge of General Education information will improve Description: Including pain rating scale, medication(s)/side effects and non-pharmacologic comfort measures Outcome: Progressing   Problem: Health Behavior/Discharge Planning: Goal: Ability to manage health-related needs will improve Outcome: Progressing   Problem: Clinical Measurements: Goal: Ability to maintain clinical measurements within normal limits will improve Outcome: Progressing Goal: Will remain free from infection Outcome: Progressing Goal: Diagnostic test results will improve Outcome: Progressing Goal: Respiratory complications will improve Outcome: Progressing Goal: Cardiovascular complication will be avoided Outcome: Progressing   Problem: Activity: Goal: Risk for activity intolerance will decrease Outcome: Progressing   Problem: Nutrition: Goal: Adequate nutrition will be maintained Outcome: Progressing   Problem: Coping: Goal: Level of anxiety will decrease Outcome: Progressing   Problem: Elimination: Goal: Will not experience complications related to bowel motility Outcome: Progressing Goal: Will not experience complications related to urinary retention Outcome: Progressing   Problem: Pain  Managment: Goal: General experience of comfort will improve Outcome: Progressing   Problem: Safety: Goal: Ability to remain free from injury will improve Outcome: Progressing   Problem: Skin Integrity: Goal: Risk for impaired skin integrity will decrease Outcome: Progressing   Problem: Education: Goal: Knowledge of risk factors and measures for prevention of condition will improve Outcome: Progressing   Problem: Coping: Goal: Psychosocial and spiritual needs will be supported Outcome: Progressing   Problem: Respiratory: Goal: Will maintain a patent airway Outcome: Progressing Goal: Complications related to the disease process, condition or treatment will be avoided or minimized Outcome: Progressing   

## 2021-02-13 NOTE — Plan of Care (Signed)
  Problem: Education: Goal: Verbalization of understanding the information provided (i.e., activity precautions, restrictions, etc) will improve Outcome: Progressing   Problem: Activity: Goal: Ability to ambulate and perform ADLs will improve Outcome: Progressing   Problem: Clinical Measurements: Goal: Postoperative complications will be avoided or minimized Outcome: Progressing   Problem: Self-Concept: Goal: Ability to maintain and perform role responsibilities to the fullest extent possible will improve Outcome: Progressing   Problem: Pain Management: Goal: Pain level will decrease Outcome: Progressing   Problem: Education: Goal: Knowledge of General Education information will improve Description: Including pain rating scale, medication(s)/side effects and non-pharmacologic comfort measures Outcome: Progressing   Problem: Health Behavior/Discharge Planning: Goal: Ability to manage health-related needs will improve Outcome: Progressing   Problem: Clinical Measurements: Goal: Ability to maintain clinical measurements within normal limits will improve Outcome: Progressing Goal: Will remain free from infection Outcome: Progressing Goal: Diagnostic test results will improve Outcome: Progressing Goal: Respiratory complications will improve Outcome: Progressing Goal: Cardiovascular complication will be avoided Outcome: Progressing   Problem: Activity: Goal: Risk for activity intolerance will decrease Outcome: Progressing   Problem: Nutrition: Goal: Adequate nutrition will be maintained Outcome: Progressing   Problem: Coping: Goal: Level of anxiety will decrease Outcome: Progressing   Problem: Elimination: Goal: Will not experience complications related to bowel motility Outcome: Progressing Goal: Will not experience complications related to urinary retention Outcome: Progressing   Problem: Pain Managment: Goal: General experience of comfort will  improve Outcome: Progressing   Problem: Safety: Goal: Ability to remain free from injury will improve Outcome: Progressing   Problem: Skin Integrity: Goal: Risk for impaired skin integrity will decrease Outcome: Progressing   Problem: Education: Goal: Knowledge of risk factors and measures for prevention of condition will improve Outcome: Progressing   Problem: Coping: Goal: Psychosocial and spiritual needs will be supported Outcome: Progressing   Problem: Respiratory: Goal: Will maintain a patent airway Outcome: Progressing Goal: Complications related to the disease process, condition or treatment will be avoided or minimized Outcome: Progressing

## 2021-02-14 DIAGNOSIS — S72011A Unspecified intracapsular fracture of right femur, initial encounter for closed fracture: Secondary | ICD-10-CM | POA: Diagnosis not present

## 2021-02-14 DIAGNOSIS — N179 Acute kidney failure, unspecified: Secondary | ICD-10-CM | POA: Diagnosis not present

## 2021-02-14 LAB — CBC
HCT: 27.4 % — ABNORMAL LOW (ref 36.0–46.0)
Hemoglobin: 8.8 g/dL — ABNORMAL LOW (ref 12.0–15.0)
MCH: 30.7 pg (ref 26.0–34.0)
MCHC: 32.1 g/dL (ref 30.0–36.0)
MCV: 95.5 fL (ref 80.0–100.0)
Platelets: 328 10*3/uL (ref 150–400)
RBC: 2.87 MIL/uL — ABNORMAL LOW (ref 3.87–5.11)
RDW: 14.3 % (ref 11.5–15.5)
WBC: 7.5 10*3/uL (ref 4.0–10.5)
nRBC: 0 % (ref 0.0–0.2)

## 2021-02-14 LAB — BASIC METABOLIC PANEL
Anion gap: 12 (ref 5–15)
BUN: 24 mg/dL — ABNORMAL HIGH (ref 8–23)
CO2: 25 mmol/L (ref 22–32)
Calcium: 7.8 mg/dL — ABNORMAL LOW (ref 8.9–10.3)
Chloride: 99 mmol/L (ref 98–111)
Creatinine, Ser: 0.73 mg/dL (ref 0.44–1.00)
GFR, Estimated: >60 mL/min (ref >60)
Glucose, Bld: 85 mg/dL (ref 70–99)
Potassium: 4.7 mmol/L (ref 3.5–5.1)
Sodium: 136 mmol/L (ref 135–145)

## 2021-02-14 LAB — C-REACTIVE PROTEIN: CRP: 17.7 mg/dL — ABNORMAL HIGH (ref <1.0)

## 2021-02-14 LAB — CULTURE, BLOOD (ROUTINE X 2): Culture: NO GROWTH

## 2021-02-14 LAB — PROCALCITONIN: Procalcitonin: 0.16 ng/mL

## 2021-02-14 LAB — MAGNESIUM: Magnesium: 2.4 mg/dL (ref 1.7–2.4)

## 2021-02-14 MED ORDER — METHYLPREDNISOLONE SODIUM SUCC 40 MG IJ SOLR
40.0000 mg | Freq: Three times a day (TID) | INTRAMUSCULAR | Status: DC
  Administered 2021-02-14 – 2021-02-15 (×3): 40 mg via INTRAVENOUS
  Filled 2021-02-14 (×3): qty 1
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
## 2021-02-14 NOTE — Progress Notes (Addendum)
Need to speak to patient to discuss SNF recommendation. Asked RN to have patient call CSW when able due to patient being on Airborne Precautions.  2:00- Attempted call into room again, no answer. Waiting on patient to call me.  Alfonso Ramus, [place] [phone]
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
## 2021-02-14 NOTE — Plan of Care (Signed)
  Problem: Education: Goal: Verbalization of understanding the information provided (i.e., activity precautions, restrictions, etc) will improve Outcome: Progressing Goal: Individualized Educational Video(s) Outcome: Progressing   Problem: Activity: Goal: Ability to ambulate and perform ADLs will improve Outcome: Progressing   Problem: Clinical Measurements: Goal: Postoperative complications will be avoided or minimized Outcome: Progressing   Problem: Self-Concept: Goal: Ability to maintain and perform role responsibilities to the fullest extent possible will improve Outcome: Progressing   Problem: Pain Management: Goal: Pain level will decrease Outcome: Progressing   Problem: Education: Goal: Knowledge of General Education information will improve Description: Including pain rating scale, medication(s)/side effects and non-pharmacologic comfort measures Outcome: Progressing   Problem: Health Behavior/Discharge Planning: Goal: Ability to manage health-related needs will improve Outcome: Progressing   Problem: Clinical Measurements: Goal: Ability to maintain clinical measurements within normal limits will improve Outcome: Progressing Goal: Will remain free from infection Outcome: Progressing Goal: Diagnostic test results will improve Outcome: Progressing Goal: Respiratory complications will improve Outcome: Progressing Goal: Cardiovascular complication will be avoided Outcome: Progressing   Problem: Activity: Goal: Risk for activity intolerance will decrease Outcome: Progressing   Problem: Nutrition: Goal: Adequate nutrition will be maintained Outcome: Progressing   Problem: Coping: Goal: Level of anxiety will decrease Outcome: Progressing   Problem: Elimination: Goal: Will not experience complications related to bowel motility Outcome: Progressing Goal: Will not experience complications related to urinary retention Outcome: Progressing   Problem: Pain  Managment: Goal: General experience of comfort will improve Outcome: Progressing   Problem: Safety: Goal: Ability to remain free from injury will improve Outcome: Progressing   Problem: Skin Integrity: Goal: Risk for impaired skin integrity will decrease Outcome: Progressing   Problem: Education: Goal: Knowledge of risk factors and measures for prevention of condition will improve Outcome: Progressing   Problem: Coping: Goal: Psychosocial and spiritual needs will be supported Outcome: Progressing   Problem: Respiratory: Goal: Will maintain a patent airway Outcome: Progressing Goal: Complications related to the disease process, condition or treatment will be avoided or minimized Outcome: Progressing

## 2021-02-15 DIAGNOSIS — S72011A Unspecified intracapsular fracture of right femur, initial encounter for closed fracture: Secondary | ICD-10-CM | POA: Diagnosis not present

## 2021-02-15 DIAGNOSIS — N179 Acute kidney failure, unspecified: Secondary | ICD-10-CM | POA: Diagnosis not present

## 2021-02-15 LAB — BASIC METABOLIC PANEL
Anion gap: 8 (ref 5–15)
BUN: 30 mg/dL — ABNORMAL HIGH (ref 8–23)
CO2: 27 mmol/L (ref 22–32)
Calcium: 8 mg/dL — ABNORMAL LOW (ref 8.9–10.3)
Chloride: 102 mmol/L (ref 98–111)
Creatinine, Ser: 0.85 mg/dL (ref 0.44–1.00)
GFR, Estimated: >60 mL/min (ref >60)
Glucose, Bld: 120 mg/dL — ABNORMAL HIGH (ref 70–99)
Potassium: 5 mmol/L (ref 3.5–5.1)
Sodium: 137 mmol/L (ref 135–145)

## 2021-02-15 LAB — MAGNESIUM: Magnesium: 2.4 mg/dL (ref 1.7–2.4)

## 2021-02-15 LAB — CBC
HCT: 25.7 % — ABNORMAL LOW (ref 36.0–46.0)
Hemoglobin: 8.4 g/dL — ABNORMAL LOW (ref 12.0–15.0)
MCH: 31.2 pg (ref 26.0–34.0)
MCHC: 32.7 g/dL (ref 30.0–36.0)
MCV: 95.5 fL (ref 80.0–100.0)
Platelets: 334 10*3/uL (ref 150–400)
RBC: 2.69 MIL/uL — ABNORMAL LOW (ref 3.87–5.11)
RDW: 14.4 % (ref 11.5–15.5)
WBC: 7.5 10*3/uL (ref 4.0–10.5)
nRBC: 0.3 % — ABNORMAL HIGH (ref 0.0–0.2)

## 2021-02-15 LAB — PROCALCITONIN: Procalcitonin: 0.1 ng/mL

## 2021-02-15 LAB — C-REACTIVE PROTEIN: CRP: 6.2 mg/dL — ABNORMAL HIGH (ref <1.0)

## 2021-02-15 MED ORDER — DEXAMETHASONE 6 MG PO TABS
6.0000 mg | ORAL_TABLET | Freq: Every day | ORAL | Status: DC
  Administered 2021-02-15 – 2021-02-19 (×5): 6 mg via ORAL
  Filled 2021-02-15 (×5): qty 1

## 2021-02-15 MED ORDER — MENTHOL 3 MG MT LOZG
1.0000 | LOZENGE | OROMUCOSAL | Status: DC | PRN
  Administered 2021-02-15: 3 mg via ORAL
  Filled 2021-02-15: qty 9
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
## 2021-02-15 NOTE — Progress Notes (Signed)
Attempted call into room again to discuss SNF recommendation, unable to reach patient. Asked RN to have patient call CSW.  Alfonso Ramus, [place] [phone]
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
## 2021-02-15 NOTE — Progress Notes (Signed)
RE: Melanie White Date of Birth: 10/25/1946 Date: 02/15/2021   To Whom It May Concern:  Please be advised that the above-named patient will require a short-term nursing home stay - anticipated 30 days or less for rehabilitation and strengthening.  The plan is for return home.
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
## 2021-02-16 DIAGNOSIS — S72011A Unspecified intracapsular fracture of right femur, initial encounter for closed fracture: Secondary | ICD-10-CM | POA: Diagnosis not present

## 2021-02-16 DIAGNOSIS — U071 COVID-19: Secondary | ICD-10-CM | POA: Diagnosis not present

## 2021-02-16 DIAGNOSIS — A0839 Other viral enteritis: Secondary | ICD-10-CM | POA: Diagnosis not present

## 2021-02-16 LAB — BASIC METABOLIC PANEL
Anion gap: 7 (ref 5–15)
BUN: 31 mg/dL — ABNORMAL HIGH (ref 8–23)
CO2: 29 mmol/L (ref 22–32)
Calcium: 8.1 mg/dL — ABNORMAL LOW (ref 8.9–10.3)
Chloride: 99 mmol/L (ref 98–111)
Creatinine, Ser: 0.7 mg/dL (ref 0.44–1.00)
GFR, Estimated: >60 mL/min (ref >60)
Glucose, Bld: 129 mg/dL — ABNORMAL HIGH (ref 70–99)
Potassium: 5 mmol/L (ref 3.5–5.1)
Sodium: 135 mmol/L (ref 135–145)

## 2021-02-16 LAB — CBC
HCT: 26.1 % — ABNORMAL LOW (ref 36.0–46.0)
Hemoglobin: 8.8 g/dL — ABNORMAL LOW (ref 12.0–15.0)
MCH: 31.7 pg (ref 26.0–34.0)
MCHC: 33.7 g/dL (ref 30.0–36.0)
MCV: 93.9 fL (ref 80.0–100.0)
Platelets: 375 10*3/uL (ref 150–400)
RBC: 2.78 MIL/uL — ABNORMAL LOW (ref 3.87–5.11)
RDW: 14.4 % (ref 11.5–15.5)
WBC: 12.7 10*3/uL — ABNORMAL HIGH (ref 4.0–10.5)
nRBC: 0.4 % — ABNORMAL HIGH (ref 0.0–0.2)

## 2021-02-16 LAB — MAGNESIUM: Magnesium: 2.4 mg/dL (ref 1.7–2.4)

## 2021-02-16 MED ORDER — LOPERAMIDE HCL 2 MG PO CAPS
2.0000 mg | ORAL_CAPSULE | Freq: Once | ORAL | Status: AC
  Administered 2021-02-16: 2 mg via ORAL
  Filled 2021-02-16: qty 1

## 2021-02-16 MED ORDER — LOPERAMIDE HCL 2 MG PO CAPS
2.0000 mg | ORAL_CAPSULE | Freq: Once | ORAL | Status: AC
  Administered 2021-02-16: 2 mg via ORAL
  Filled 2021-02-16 (×2): qty 1
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
## 2021-02-16 NOTE — Progress Notes (Signed)
Subjective: 6 Days Post-Op Procedure(s) (LRB): ARTHROPLASTY BIPOLAR HIP (HEMIARTHROPLASTY) (Right)   Patient is comfortable and stable.  Dressing is dry.  Waiting [employer].  Patient reports pain as mild.  Objective:   VITALS:   Vitals:   02/16/21 1051 02/16/21 1800  BP: (!) 152/75 (!) 148/72  Pulse: 88 90  Resp:  19  Temp:  98.3 F (36.8 C)  SpO2:  96%    Neurologically intact Intact pulses distally Incision: dressing C/D/I  LABS Recent Labs    02/14/21 0730 02/15/21 0700 02/16/21 0403  HGB 8.8* 8.4* 8.8*  HCT 27.4* 25.7* 26.1*  WBC 7.5 7.5 12.7*  PLT 328 334 375    Recent Labs    02/14/21 0730 02/15/21 0700 02/16/21 0403  NA 136 137 135  K 4.7 5.0 5.0  BUN 24* 30* 31*  CREATININE 0.73 0.85 0.70  GLUCOSE 85 120* 129*    No results for input(s): LABPT, INR in the last 72 hours.   Assessment/Plan: 6 Days Post-Op Procedure(s) (LRB): ARTHROPLASTY BIPOLAR HIP (HEMIARTHROPLASTY) (Right)   Advance diet Up with therapy Discharge to SNF

## 2021-02-16 NOTE — Progress Notes (Signed)
OT Cancellation Note  Patient Details Name: Melanie White MRN: [id number] DOB: 01-08-1946   Cancelled Treatment:    Reason Eval/Treat Not Completed: Other (comment)  Pt with several episodes of loose/liquid stool with PTA this date. Will hold OT for now and f/u at later date/time as able. Thank you.  Rejeana Brock, MS, OTR/L ascom [phone] 02/16/21, 2:55 PM

## 2021-02-17 DIAGNOSIS — S72011A Unspecified intracapsular fracture of right femur, initial encounter for closed fracture: Secondary | ICD-10-CM | POA: Diagnosis not present

## 2021-02-17 LAB — CBC
HCT: 24.4 % — ABNORMAL LOW (ref 36.0–46.0)
Hemoglobin: 7.8 g/dL — ABNORMAL LOW (ref 12.0–15.0)
MCH: 31.2 pg (ref 26.0–34.0)
MCHC: 32 g/dL (ref 30.0–36.0)
MCV: 97.6 fL (ref 80.0–100.0)
Platelets: 387 10*3/uL (ref 150–400)
RBC: 2.5 MIL/uL — ABNORMAL LOW (ref 3.87–5.11)
RDW: 14.8 % (ref 11.5–15.5)
WBC: 14 10*3/uL — ABNORMAL HIGH (ref 4.0–10.5)
nRBC: 1.4 % — ABNORMAL HIGH (ref 0.0–0.2)

## 2021-02-17 LAB — BASIC METABOLIC PANEL
Anion gap: 8 (ref 5–15)
BUN: 28 mg/dL — ABNORMAL HIGH (ref 8–23)
CO2: 27 mmol/L (ref 22–32)
Calcium: 8 mg/dL — ABNORMAL LOW (ref 8.9–10.3)
Chloride: 99 mmol/L (ref 98–111)
Creatinine, Ser: 0.64 mg/dL (ref 0.44–1.00)
GFR, Estimated: >60 mL/min (ref >60)
Glucose, Bld: 79 mg/dL (ref 70–99)
Potassium: 3.8 mmol/L (ref 3.5–5.1)
Sodium: 134 mmol/L — ABNORMAL LOW (ref 135–145)

## 2021-02-17 LAB — MAGNESIUM: Magnesium: 1.9 mg/dL (ref 1.7–2.4)

## 2021-02-17 LAB — C-REACTIVE PROTEIN: CRP: 1.5 mg/dL — ABNORMAL HIGH (ref <1.0)

## 2021-02-17 LAB — VITAMIN B12: Vitamin B-12: 307 pg/mL (ref 180–914)

## 2021-02-17 LAB — IRON AND TIBC
Iron: 44 ug/dL (ref 28–170)
Saturation Ratios: 24 % (ref 10.4–31.8)
TIBC: 182 ug/dL — ABNORMAL LOW (ref 250–450)
UIBC: 138 ug/dL

## 2021-02-17 LAB — FOLATE: Folate: 15.8 ng/mL (ref >5.9)

## 2021-02-17 MED ORDER — LOPERAMIDE HCL 2 MG PO CAPS
2.0000 mg | ORAL_CAPSULE | Freq: Two times a day (BID) | ORAL | Status: DC | PRN
  Administered 2021-02-17 (×2): 2 mg via ORAL
  Filled 2021-02-17 (×3): qty 1

## 2021-02-17 NOTE — Progress Notes (Signed)
PT Cancellation Note  Patient Details Name: Melanie White MRN: [id number] DOB: 27-Aug-1946   Cancelled Treatment:    Reason Eval/Treat Not Completed: Other (comment)   Pt offered and encouraged to participate in session but firmly refused after multiple attempts to encourage and risks/benefits explained.  Stated she did not feel well.  LPN reported ongoing loose stools and nausea with meds given.  Will continue tomorrow.   Danielle Dess 02/17/2021, 11:58 AM
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
## 2021-02-17 NOTE — Progress Notes (Signed)
Subjective: 7 Days Post-Op Procedure(s) (LRB): ARTHROPLASTY BIPOLAR HIP (HEMIARTHROPLASTY) (Right) Patient is sleeping today.  Wound is dry.  Hip appears stable.  Hemoglobin 7.8.  Patient reports pain as none.  Objective:   VITALS:   Vitals:   02/17/21 0750 02/17/21 1524  BP: (!) 149/60 (!) 140/95  Pulse: 77 85  Resp: 16 16  Temp: 98.1 F (36.7 C) 98.6 F (37 C)  SpO2: 93% 97%    Exam: Dressing is dry and intact.  Leg lengths are equal.  Alignment is good.  LABS Recent Labs    02/15/21 0700 02/16/21 0403 02/17/21 0655  HGB 8.4* 8.8* 7.8*  HCT 25.7* 26.1* 24.4*  WBC 7.5 12.7* 14.0*  PLT 334 375 387    Recent Labs    02/15/21 0700 02/16/21 0403 02/17/21 0655  NA 137 135 134*  K 5.0 5.0 3.8  BUN 30* 31* 28*  CREATININE 0.85 0.70 0.64  GLUCOSE 120* 129* 79    No results for input(s): LABPT, INR in the last 72 hours.   Assessment/Plan: 7 Days Post-Op Procedure(s) (LRB): ARTHROPLASTY BIPOLAR HIP (HEMIARTHROPLASTY) (Right)   Up with therapy Discharge to SNF

## 2021-02-18 DIAGNOSIS — A0839 Other viral enteritis: Secondary | ICD-10-CM | POA: Diagnosis not present

## 2021-02-18 DIAGNOSIS — U071 COVID-19: Secondary | ICD-10-CM | POA: Diagnosis not present

## 2021-02-18 DIAGNOSIS — D75839 Thrombocytosis, unspecified: Secondary | ICD-10-CM

## 2021-02-18 DIAGNOSIS — S72011A Unspecified intracapsular fracture of right femur, initial encounter for closed fracture: Secondary | ICD-10-CM | POA: Diagnosis not present

## 2021-02-18 LAB — CBC
HCT: 26.9 % — ABNORMAL LOW (ref 36.0–46.0)
Hemoglobin: 8.8 g/dL — ABNORMAL LOW (ref 12.0–15.0)
MCH: 31.4 pg (ref 26.0–34.0)
MCHC: 32.7 g/dL (ref 30.0–36.0)
MCV: 96.1 fL (ref 80.0–100.0)
Platelets: 496 10*3/uL — ABNORMAL HIGH (ref 150–400)
RBC: 2.8 MIL/uL — ABNORMAL LOW (ref 3.87–5.11)
RDW: 14.9 % (ref 11.5–15.5)
WBC: 23.8 10*3/uL — ABNORMAL HIGH (ref 4.0–10.5)
nRBC: 0.8 % — ABNORMAL HIGH (ref 0.0–0.2)

## 2021-02-18 LAB — BASIC METABOLIC PANEL
Anion gap: 9 (ref 5–15)
BUN: 26 mg/dL — ABNORMAL HIGH (ref 8–23)
CO2: 24 mmol/L (ref 22–32)
Calcium: 8.4 mg/dL — ABNORMAL LOW (ref 8.9–10.3)
Chloride: 103 mmol/L (ref 98–111)
Creatinine, Ser: 0.7 mg/dL (ref 0.44–1.00)
GFR, Estimated: >60 mL/min (ref >60)
Glucose, Bld: 69 mg/dL — ABNORMAL LOW (ref 70–99)
Potassium: 4.3 mmol/L (ref 3.5–5.1)
Sodium: 136 mmol/L (ref 135–145)

## 2021-02-18 LAB — MAGNESIUM: Magnesium: 1.8 mg/dL (ref 1.7–2.4)

## 2021-02-18 MED ORDER — DIPHENOXYLATE-ATROPINE 2.5-0.025 MG PO TABS
1.0000 | ORAL_TABLET | Freq: Four times a day (QID) | ORAL | Status: DC | PRN
  Administered 2021-02-18 – 2021-02-24 (×5): 1 via ORAL
  Filled 2021-02-18 (×5): qty 1

## 2021-02-18 MED ORDER — SCOPOLAMINE 1 MG/3DAYS TD PT72
1.0000 | MEDICATED_PATCH | TRANSDERMAL | Status: DC
  Administered 2021-02-18 – 2021-03-11 (×6): 1.5 mg via TRANSDERMAL
  Filled 2021-02-18 (×8): qty 1

## 2021-02-18 MED ORDER — BOOST / RESOURCE BREEZE PO LIQD CUSTOM
1.0000 | Freq: Four times a day (QID) | ORAL | Status: DC
  Administered 2021-02-18 – 2021-02-21 (×2): 1 via ORAL

## 2021-02-18 MED ORDER — DRONABINOL 2.5 MG PO CAPS
2.5000 mg | ORAL_CAPSULE | Freq: Two times a day (BID) | ORAL | Status: DC
  Administered 2021-02-18 – 2021-03-11 (×40): 2.5 mg via ORAL
  Filled 2021-02-18 (×41): qty 1

## 2021-02-18 NOTE — Progress Notes (Signed)
Nutrition Follow-up  DOCUMENTATION CODES:   Severe malnutrition in context of chronic illness  INTERVENTION:  Will discontinue Ensure [employer] and [employer].  Provide Boost Breeze po QID, each supplement provides 250 kcal and 9 grams of protein.  Continue MVI po daily.  If PO intake unable to improve will need to consider placement of NGT for initiation of tube feeds if this aligns with patient's goals of care. If plan is for initiation of tube feeds recommend: -Initiate Osmolite 1.2 Cal at 20 mL/hr and advance by 20 mL/hr every 12 hours to goal rate of 60 mL/hr -Provides 1728 kcal, 80 grams of protein, 1181 mL H2O daily  Monitor magnesium, potassium, and phosphorus daily for at least 3 days, MD to replete as needed, as pt is at risk for refeeding syndrome given prolonged inadequate oral intake.  NUTRITION DIAGNOSIS:   Severe Malnutrition related to social / environmental circumstances (suspected inadequate oral intake) as evidenced by severe fat depletion,severe muscle depletion.  Ongoing.  GOAL:   Patient will meet greater than or equal to 90% of their needs  Not met.  MONITOR:   Supplement acceptance,Labs,Weight trends,PO intake,Skin,I & O's  REASON FOR ASSESSMENT:   Consult Assessment of nutrition requirement/status  ASSESSMENT:   75 year old female with PMHx of anxiety, depression, HTN, HLD admitted with closed displaced fracture of right femoral neck s/p right hip hemiarthroplasty 2/20, also with gastroenteritis, COVID-19 infection, AKI.  Met with patient at bedside. She reports she has poor appetite and PO intake. She reports she has not been eating her meals. RD brought meal tray into room for patient and she stated she did not want any of it. She endorses nausea and diarrhea. She reports she is only eating bites of crackers and sips of water and Ginger-ale. She is not drinking Ensure or eating any Magic Cup. She is amenable to trying Boost Breeze but does not want  to try any other supplements.  Medications reviewed and include: Decadron 6 mg daily, Marinol 2.5 mg BID before lunch and supper, MVI daily, scopolamine patch.  Labs reviewed: BUN 26.  I/O: 51 mL UOP yesterday + 4 occurrences unmeasured UOP yesterday  No weight to trend since 49.4 kg  Discussed with RN in-person and MD via secure chat. Plan is to try to get nausea and diarrhea under control and try appetite stimulant. If this does not help will need to consider NGT placement for tube feeds. Of note patient reported to RD today she likely will not want to do NGT for tube feeds.  Diet Order:   Diet Order            Diet regular Room service appropriate? Yes; Fluid consistency: Thin  Diet effective now                EDUCATION NEEDS:   No education needs have been identified at this time  Skin:  Skin Assessment: Skin Integrity Issues: Skin Integrity Issues:: Stage I,Incisions Stage I: sacrum Incisions: closed incision to right hip  Last BM:  02/18/2021 - medium type 7  Height:   Ht Readings from Last 1 Encounters:  02/09/21 _0  (1.651 m)   Weight:   Wt Readings from Last 1 Encounters:  02/10/21 49.4 kg   BMI:  Body mass index is 18.14 kg/m.  Estimated Nutritional Needs:   Kcal:  1500-1700  Protein:  75-85 grams  Fluid:  1.2-1.5 L/day  Jacklynn Barnacle, MS, RD, LDN Pager number available on Amion

## 2021-02-18 NOTE — Progress Notes (Addendum)
OT Cancellation Note  Patient Details Name: Melanie White MRN: [id number] DOB: 1946-07-10   Cancelled Treatment:    Reason Eval/Treat Not Completed: Patient declined. Chart reviewed. Upon entering room, pt stated that she was too weak to participate, also endorsed nausea. Therapist repeatedly encouraged pt to try to engage, but she steadfastly refused. NTs reported that pt had also refused their attempts at care. Will attempt again at a later time/date, as pt is available and able to engage.  Latina Craver 02/18/2021, 2:45 PM

## 2021-02-18 NOTE — Progress Notes (Signed)
PT Cancellation Note  Patient Details Name: Melanie White MRN: [id number] DOB: March 02, 1946   Cancelled Treatment:    Reason Eval/Treat Not Completed: Other (comment)   Offered and encouraged therapy again today.  She continues to state she does not feel well. She appears generally comfortable in bed and not in distress.   Explained risks of immobility including pneumonia, blood clots and deconditioning etc but she remains firm in her refusal.  "I ain't doing it!"  She stated she would tell us when she felt up to it but would not participate until that time.  Offered even supine exercises but she continued to refuse verbally, shaking her head No and turning her face toward the widows away from me.  Will continue to offer and encourage services daily.   Danielle Dess 02/18/2021, 1:01 PM

## 2021-02-18 NOTE — TOC Progression Note (Addendum)
Transition of Care [facility]) - Progression Note    Patient Details  Name: Melanie White MRN: [id number] Date of Birth: 1946-02-25  Transition of Care [facility]) CM/SW Contact  Liliana Cline, LCSW Phone Number: 02/18/2021, 9:45 AM  Clinical Narrative:   Patient has insurance auth for SNF as of 02/16/21. CSW reached out to [place] at [employer] who confirmed they can take patient today if she is medically ready. Asked MD.  Uploaded PASRR additional information.  10:25- Updated Tanya that per MD, patient not medically ready today, possibly tomorrow.  Expected Discharge Plan: Skilled Nursing Facility Barriers to Discharge: Continued Medical Work up  Expected Discharge Plan and Services Expected Discharge Plan: Skilled Nursing Facility       Living arrangements for the past 2 months: Single Family Home                                       Social Determinants of Health (SDOH) Interventions    Readmission Risk Interventions No flowsheet data found.
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
## 2021-02-19 DIAGNOSIS — I10 Essential (primary) hypertension: Secondary | ICD-10-CM | POA: Diagnosis not present

## 2021-02-19 DIAGNOSIS — S72011A Unspecified intracapsular fracture of right femur, initial encounter for closed fracture: Secondary | ICD-10-CM | POA: Diagnosis not present

## 2021-02-19 DIAGNOSIS — A0839 Other viral enteritis: Secondary | ICD-10-CM | POA: Diagnosis not present

## 2021-02-19 DIAGNOSIS — U071 COVID-19: Secondary | ICD-10-CM | POA: Diagnosis not present

## 2021-02-19 LAB — BASIC METABOLIC PANEL
Anion gap: 11 (ref 5–15)
BUN: 27 mg/dL — ABNORMAL HIGH (ref 8–23)
CO2: 23 mmol/L (ref 22–32)
Calcium: 8.1 mg/dL — ABNORMAL LOW (ref 8.9–10.3)
Chloride: 103 mmol/L (ref 98–111)
Creatinine, Ser: 0.69 mg/dL (ref 0.44–1.00)
GFR, Estimated: >60 mL/min (ref >60)
Glucose, Bld: 69 mg/dL — ABNORMAL LOW (ref 70–99)
Potassium: 3.6 mmol/L (ref 3.5–5.1)
Sodium: 137 mmol/L (ref 135–145)

## 2021-02-19 LAB — CBC
HCT: 26 % — ABNORMAL LOW (ref 36.0–46.0)
Hemoglobin: 8.4 g/dL — ABNORMAL LOW (ref 12.0–15.0)
MCH: 31.5 pg (ref 26.0–34.0)
MCHC: 32.3 g/dL (ref 30.0–36.0)
MCV: 97.4 fL (ref 80.0–100.0)
Platelets: 518 10*3/uL — ABNORMAL HIGH (ref 150–400)
RBC: 2.67 MIL/uL — ABNORMAL LOW (ref 3.87–5.11)
RDW: 15.8 % — ABNORMAL HIGH (ref 11.5–15.5)
WBC: 23.6 10*3/uL — ABNORMAL HIGH (ref 4.0–10.5)
nRBC: 0.3 % — ABNORMAL HIGH (ref 0.0–0.2)

## 2021-02-19 LAB — MAGNESIUM: Magnesium: 1.7 mg/dL (ref 1.7–2.4)

## 2021-02-19 MED ORDER — DEXAMETHASONE 4 MG PO TABS
4.0000 mg | ORAL_TABLET | Freq: Every day | ORAL | Status: DC
  Administered 2021-02-20 – 2021-02-22 (×2): 4 mg via ORAL
  Filled 2021-02-19 (×3): qty 1

## 2021-02-19 NOTE — TOC Progression Note (Signed)
Transition of Care [facility]) - Progression Note    Patient Details  Name: Melanie White MRN: [id number] Date of Birth: 10-26-1946  [facility]) CM/SW Contact  Liliana Cline, LCSW Phone Number: 02/19/2021, 9:56 AM  Clinical Narrative:   Per MD patient not medically ready for SNF today. Updated Kenney Houseman at [facility].   Insurance auth expires today. [facility], spoke with Representative April. She reported she can update the dates, asked for updated clinicals to be sent first thing tomorrow morning to fax # [phone] or upload on [employer] portal.  Expected Discharge Plan: Skilled Nursing Facility Barriers to Discharge: Continued Medical Work up  Expected Discharge Plan and Services Expected Discharge Plan: Skilled Nursing Facility       Living arrangements for the past 2 months: Single Family Home                                       Social Determinants of Health (SDOH) Interventions    Readmission Risk Interventions No flowsheet data found.
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
## 2021-02-19 NOTE — Care Management Important Message (Signed)
Important Message  Patient Details  Name: Melanie White MRN: [id number] Date of Birth: 10/19/1946   Medicare Important Message Given:  Other (see comment)  Patient is in a isolation room and I called her room to review the Important Message from Medicare with her but no answer.  Will try again.   [name] 02/19/2021, 9:25 AM
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
## 2021-02-19 NOTE — Progress Notes (Signed)
Pt bed alarm was ringing from behind a closed door as she in on [employer]. AD Tam Levada Schilling was coming down the hall and answered bed alarm and found pt sitting on the floor on her bottom.  Pt was trying to use the bathroom she stated.  Pt denies any pain and denies hitting her head.  She was assisted back into bed by AD without issue.  Pt stated she did not have any family for this nurse to contact.  Bed was placed on lowest position, bed alarm set, fall mat in place, pt with call bell and informed to call and wait for staff prior to ambulating to [facility], she agreed.

## 2021-02-19 NOTE — Anesthesia Postprocedure Evaluation (Signed)
Anesthesia Post Note  Patient: Melanie White  Procedure(s) Performed: ARTHROPLASTY BIPOLAR HIP (HEMIARTHROPLASTY) (Right Hip)  Patient location during evaluation: PACU Anesthesia Type: General Level of consciousness: awake and alert Pain management: pain level controlled Vital Signs Assessment: post-procedure vital signs reviewed and stable Respiratory status: spontaneous breathing, nonlabored ventilation, respiratory function stable and patient connected to nasal cannula oxygen Cardiovascular status: blood pressure returned to baseline and stable Postop Assessment: no apparent nausea or vomiting Anesthetic complications: no   No complications documented.   Last Vitals:  Vitals:   02/19/21 0254 02/19/21 0844  BP: (!) 149/68 (!) 159/66  Pulse: 81 80  Resp: 18 16  Temp: 36.6 C 36.5 C  SpO2: 98% 100%    Last Pain:  Vitals:   02/19/21 0844  TempSrc: Oral  PainSc:                  Yevette Edwards
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
## 2021-02-20 DIAGNOSIS — I10 Essential (primary) hypertension: Secondary | ICD-10-CM | POA: Diagnosis not present

## 2021-02-20 DIAGNOSIS — S72011A Unspecified intracapsular fracture of right femur, initial encounter for closed fracture: Secondary | ICD-10-CM | POA: Diagnosis not present

## 2021-02-20 DIAGNOSIS — A0839 Other viral enteritis: Secondary | ICD-10-CM | POA: Diagnosis not present

## 2021-02-20 DIAGNOSIS — U071 COVID-19: Secondary | ICD-10-CM | POA: Diagnosis not present

## 2021-02-20 LAB — CBC
HCT: 24.1 % — ABNORMAL LOW (ref 36.0–46.0)
Hemoglobin: 7.8 g/dL — ABNORMAL LOW (ref 12.0–15.0)
MCH: 32 pg (ref 26.0–34.0)
MCHC: 32.4 g/dL (ref 30.0–36.0)
MCV: 98.8 fL (ref 80.0–100.0)
Platelets: 489 10*3/uL — ABNORMAL HIGH (ref 150–400)
RBC: 2.44 MIL/uL — ABNORMAL LOW (ref 3.87–5.11)
RDW: 16.1 % — ABNORMAL HIGH (ref 11.5–15.5)
WBC: 23.1 10*3/uL — ABNORMAL HIGH (ref 4.0–10.5)
nRBC: 0.3 % — ABNORMAL HIGH (ref 0.0–0.2)

## 2021-02-20 LAB — BASIC METABOLIC PANEL
Anion gap: 9 (ref 5–15)
BUN: 28 mg/dL — ABNORMAL HIGH (ref 8–23)
CO2: 24 mmol/L (ref 22–32)
Calcium: 8.1 mg/dL — ABNORMAL LOW (ref 8.9–10.3)
Chloride: 104 mmol/L (ref 98–111)
Creatinine, Ser: 0.73 mg/dL (ref 0.44–1.00)
GFR, Estimated: >60 mL/min (ref >60)
Glucose, Bld: 68 mg/dL — ABNORMAL LOW (ref 70–99)
Potassium: 3.7 mmol/L (ref 3.5–5.1)
Sodium: 137 mmol/L (ref 135–145)

## 2021-02-20 LAB — MAGNESIUM: Magnesium: 1.8 mg/dL (ref 1.7–2.4)

## 2021-02-20 LAB — OCCULT BLOOD X 1 CARD TO LAB, STOOL: Fecal Occult Bld: POSITIVE — AB

## 2021-02-20 MED ORDER — RISAQUAD PO CAPS
1.0000 | ORAL_CAPSULE | Freq: Every day | ORAL | Status: DC
  Administered 2021-02-20 – 2021-03-11 (×17): 1 via ORAL
  Filled 2021-02-20 (×20): qty 1

## 2021-02-20 NOTE — Progress Notes (Signed)
PT Cancellation Note  Patient Details Name: SHEYLA ZAFFINO MRN: [id number] DOB: [birth year]   Cancelled Treatment:     PT attempt. Pt refused at this time. Max encouragement to participate however pt unwilling." I've already been up today and I don't feel well." RN did confirm pt has been OOB to [facility] earlier in the day. Acute PT will continue efforts to progress pt per POC.    Rushie Chestnut 02/20/2021, 10:45 AM

## 2021-02-20 NOTE — Progress Notes (Signed)
PT Cancellation Note  Patient Details Name: Melanie White MRN: [id number] DOB: 1946/05/18   Cancelled Treatment:     PT attempt. 2nd attempt today. Pt continues to refuse." I've been getting up." Acute PT will continue to follow and progress pt as able per POC.    Rushie Chestnut 02/20/2021, 3:57 PM

## 2021-02-20 NOTE — Care Management Important Message (Signed)
Important Message  Patient Details  Name: Melanie White MRN: [id number] Date of Birth: Feb 05, 1946   Medicare Important Message Given:  Other (see comment)  Patient is in a isolation room and I tried reaching her by phone [phone]) to review the Important Message from [facility] but no answer. Will try again.  Olegario Messier A Teale Goodgame 02/20/2021, 12:39 PM
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
## 2021-02-20 NOTE — TOC Progression Note (Addendum)
Transition of Care [facility]) - Progression Note    Patient Details  Name: Melanie White MRN: [id number] Date of Birth: Sep 09, 1946  Transition of Care [facility]) CM/SW Contact  Liliana Cline, LCSW Phone Number: 02/20/2021, 10:30 AM  Clinical Narrative:   Uploaded updated clinicals to [facility]. Per MD, patient not medically ready, still having diarrhea, possibly tomorrow. CSW updated Kenney Houseman at [employer].    Expected Discharge Plan: Skilled Nursing Facility Barriers to Discharge: Continued Medical Work up  Expected Discharge Plan and Services Expected Discharge Plan: Skilled Nursing Facility       Living arrangements for the past 2 months: Single Family Home                                       Social Determinants of Health (SDOH) Interventions    Readmission Risk Interventions No flowsheet data found.
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
## 2021-02-21 ENCOUNTER — Encounter: Payer: Self-pay | Admitting: Internal Medicine

## 2021-02-21 ENCOUNTER — Encounter
Admission: EM | Disposition: A | Discharge status: Skilled Nursing Facility | Payer: Self-pay | Source: Home / Self Care | Attending: Hospitalist

## 2021-02-21 ENCOUNTER — Inpatient Hospital Stay: Payer: Medicare Other | Admitting: Anesthesiology

## 2021-02-21 DIAGNOSIS — U071 COVID-19: Secondary | ICD-10-CM | POA: Diagnosis not present

## 2021-02-21 DIAGNOSIS — K922 Gastrointestinal hemorrhage, unspecified: Secondary | ICD-10-CM

## 2021-02-21 DIAGNOSIS — D62 Acute posthemorrhagic anemia: Secondary | ICD-10-CM

## 2021-02-21 DIAGNOSIS — A0839 Other viral enteritis: Secondary | ICD-10-CM | POA: Diagnosis not present

## 2021-02-21 HISTORY — PX: ESOPHAGOGASTRODUODENOSCOPY (EGD) WITH PROPOFOL: SHX5813

## 2021-02-21 LAB — BASIC METABOLIC PANEL
Anion gap: 10 (ref 5–15)
BUN: 49 mg/dL — ABNORMAL HIGH (ref 8–23)
CO2: 23 mmol/L (ref 22–32)
Calcium: 8.1 mg/dL — ABNORMAL LOW (ref 8.9–10.3)
Chloride: 106 mmol/L (ref 98–111)
Creatinine, Ser: 1.11 mg/dL — ABNORMAL HIGH (ref 0.44–1.00)
GFR, Estimated: 52 mL/min — ABNORMAL LOW (ref >60)
Glucose, Bld: 71 mg/dL (ref 70–99)
Potassium: 4.1 mmol/L (ref 3.5–5.1)
Sodium: 139 mmol/L (ref 135–145)

## 2021-02-21 LAB — CBC
HCT: 19.9 % — ABNORMAL LOW (ref 36.0–46.0)
Hemoglobin: 6.6 g/dL — ABNORMAL LOW (ref 12.0–15.0)
MCH: 32.7 pg (ref 26.0–34.0)
MCHC: 33.2 g/dL (ref 30.0–36.0)
MCV: 98.5 fL (ref 80.0–100.0)
Platelets: 433 10*3/uL — ABNORMAL HIGH (ref 150–400)
RBC: 2.02 MIL/uL — ABNORMAL LOW (ref 3.87–5.11)
RDW: 17 % — ABNORMAL HIGH (ref 11.5–15.5)
WBC: 19.9 10*3/uL — ABNORMAL HIGH (ref 4.0–10.5)
nRBC: 0.5 % — ABNORMAL HIGH (ref 0.0–0.2)

## 2021-02-21 LAB — PREPARE RBC (CROSSMATCH)

## 2021-02-21 LAB — HEMOGLOBIN AND HEMATOCRIT, BLOOD
HCT: 18.9 % — ABNORMAL LOW (ref 36.0–46.0)
Hemoglobin: 6 g/dL — ABNORMAL LOW (ref 12.0–15.0)

## 2021-02-21 LAB — MAGNESIUM: Magnesium: 1.9 mg/dL (ref 1.7–2.4)

## 2021-02-21 LAB — ABO/RH: ABO/RH(D): O POS

## 2021-02-21 SURGERY — ESOPHAGOGASTRODUODENOSCOPY (EGD) WITH PROPOFOL
Anesthesia: General

## 2021-02-21 MED ORDER — PROPOFOL 500 MG/50ML IV EMUL
INTRAVENOUS | Status: AC
  Filled 2021-02-21: qty 50

## 2021-02-21 MED ORDER — SODIUM CHLORIDE 0.9% IV SOLUTION: Freq: Once | INTRAVENOUS | Status: DC

## 2021-02-21 MED ORDER — PROPOFOL 500 MG/50ML IV EMUL
INTRAVENOUS | Status: DC | PRN
  Administered 2021-02-21: 150 ug/kg/min via INTRAVENOUS

## 2021-02-21 MED ORDER — SODIUM CHLORIDE 0.9 % IV SOLN
8.0000 mg/h | INTRAVENOUS | Status: DC
  Administered 2021-02-21: 8 mg/h via INTRAVENOUS
  Filled 2021-02-21 (×2): qty 80

## 2021-02-21 MED ORDER — SODIUM CHLORIDE 0.9 % IV SOLN: INTRAVENOUS | Status: DC

## 2021-02-21 MED ORDER — SODIUM CHLORIDE 0.9 % IV SOLN
80.0000 mg | Freq: Once | INTRAVENOUS | Status: AC
  Administered 2021-02-21: 80 mg via INTRAVENOUS
  Filled 2021-02-21: qty 80

## 2021-02-21 MED ORDER — LIDOCAINE HCL (CARDIAC) PF 100 MG/5ML IV SOSY
PREFILLED_SYRINGE | INTRAVENOUS | Status: DC | PRN
  Administered 2021-02-21: 50 mg via INTRAVENOUS

## 2021-02-21 MED ORDER — LIDOCAINE HCL (PF) 2 % IJ SOLN
INTRAMUSCULAR | Status: AC
  Filled 2021-02-21: qty 5

## 2021-02-21 MED ORDER — PROPOFOL 10 MG/ML IV BOLUS
INTRAVENOUS | Status: DC | PRN
Start: 2021-02-21 — End: 2021-02-21
  Administered 2021-02-21: 20 mg via INTRAVENOUS

## 2021-02-21 NOTE — Op Note (Signed)
[facility] Gastroenterology Patient Name: Melanie White Procedure Date: 02/21/2021 1:20 PM MRN: [id number] Account #: [id number] Date of Birth: [birth year] Admit Type: Inpatient Age: 75 Room: [facility] ENDO ROOM 2 Gender: Female Note Status: Finalized Procedure:             Upper GI endoscopy Indications:           Acute post hemorrhagic anemia, Hematemesis, Melena Providers:             Boykin Nearing. Norma Fredrickson MD, MD Referring MD:          No Local Md, MD (Referring MD) Medicines:             Propofol per Anesthesia Complications:         No immediate complications. Estimated blood loss: None. Procedure:             Pre-Anesthesia Assessment:                        - The risks and benefits of the procedure and the                         sedation options and risks were discussed with the                         patient. All questions were answered and informed                         consent was obtained.                        - Patient identification and proposed procedure were                         verified prior to the procedure by the nurse. The                         procedure was verified in the procedure room.                        - ASA Grade Assessment: III - A patient with severe                         systemic disease.                        - After reviewing the risks and benefits, the patient                         was deemed in satisfactory condition to undergo the                         procedure.                        After obtaining informed consent, the endoscope was                         passed under direct vision. Throughout the procedure,  the patient's blood pressure, pulse, and oxygen                         saturations were monitored continuously. The Endoscope                         was introduced through the mouth, and advanced to the                         third part of duodenum. The upper GI endoscopy was                          accomplished without difficulty. The patient tolerated                         the procedure well. Findings:      Moderately severe esophagitis with no bleeding was found in the distal       esophagus. Biopsies were taken with a cold forceps for histology.      Scattered moderate inflammation characterized by erosions and erythema       and coffee ground material was found in the gastric antrum.      The cardia and gastric fundus were normal on retroflexion.      Three non-bleeding cratered duodenal ulcers with adherent clot were       found in the first portion of the duodenum. The largest lesion was 7 mm       in largest dimension.      Few non-bleeding linear and superficial duodenal ulcers with no stigmata       of bleeding were found in the second portion of the duodenum and in the       third portion of the duodenum.      The exam was otherwise without abnormality. Impression:            - Moderately severe monilial, chronic and erosive                         esophagitis with no bleeding. Biopsied.                        - Gastritis.                        - Non-bleeding duodenal ulcers with adherent clot.                        - Non-bleeding duodenal ulcers with no stigmata of                         bleeding.                        - The examination was otherwise normal. Recommendation:        - check h pylori stool AG                        - Clear liquid diet today.                        - Give Protonix (pantoprazole): 8 mg/hr IV by  continuous infusion.                        - Continue serial H/H.                        - GI continuing to follow clinical course.                        - Return patient to hospital ward for ongoing care. Procedure Code(s):     --- Professional ---                        [id number], Esophagogastroduodenoscopy, flexible,                         transoral; with biopsy, single or multiple Diagnosis  Code(s):     --- Professional ---                        K92.1, Melena (includes Hematochezia)                        K92.0, Hematemesis                        D62, Acute posthemorrhagic anemia                        K26.9, Duodenal ulcer, unspecified as acute or                         chronic, without hemorrhage or perforation                        K26.4, Chronic or unspecified duodenal ulcer with                         hemorrhage                        K29.70, Gastritis, unspecified, without bleeding                        K20.80, Other esophagitis without bleeding                        B37.81, Candidal esophagitis CPT copyright 2019 American Medical Association. All rights reserved. The codes documented in this report are preliminary and upon coder review may  be revised to meet current compliance requirements. Stanton Kidney MD, MD 02/21/2021 1:40:41 PM This report has been signed electronically. Number of Addenda: 0 Note Initiated On: 02/21/2021 1:20 PM Estimated Blood Loss:  Estimated blood loss: none.      [facility]

## 2021-02-21 NOTE — Anesthesia Postprocedure Evaluation (Signed)
Anesthesia Post Note  Patient: Melanie White  Procedure(s) Performed: ESOPHAGOGASTRODUODENOSCOPY (EGD) WITH PROPOFOL (N/A )  Patient location during evaluation: Endoscopy Anesthesia Type: General Level of consciousness: awake and alert Pain management: pain level controlled Vital Signs Assessment: post-procedure vital signs reviewed and stable Respiratory status: spontaneous breathing, nonlabored ventilation and respiratory function stable Cardiovascular status: blood pressure returned to baseline and stable Postop Assessment: no apparent nausea or vomiting Anesthetic complications: no   No complications documented.   Last Vitals:  Vitals:   02/21/21 1357 02/21/21 1417  BP: (!) 149/70 (!) 169/70  Pulse: 90 89  Resp: 18 18  Temp:    SpO2: 99% 99%    Last Pain:  Vitals:   02/21/21 1347  TempSrc:   PainSc: Asleep                 Christia Reading

## 2021-02-21 NOTE — Anesthesia Preprocedure Evaluation (Addendum)
Anesthesia Evaluation  Patient identified by MRN, date of birth, ID band Patient awake    Reviewed: Allergy & Precautions, H&P , NPO status , reviewed documented beta blocker date and time   Airway Mallampati: II  TM Distance: >3 FB Neck ROM: limited    Dental  (+) Poor Dentition, Edentulous Upper, Missing   Pulmonary Current Smoker and Patient abstained from smoking.,    Pulmonary exam normal        Cardiovascular hypertension, Normal cardiovascular exam     Neuro/Psych PSYCHIATRIC DISORDERS Anxiety Depression Consent signed by medical, no family avail   GI/Hepatic GERD  Controlled,  Endo/Other    Renal/GU ARFRenal disease     Musculoskeletal   Abdominal   Peds  Hematology   Anesthesia Other Findings Past Medical History: No date: Anxiety No date: Depression No date: Hyperlipidemia No date: Hypertension  Past Surgical History: No date: ABDOMINAL HYSTERECTOMY     Comment:  Partial Hysterectomy 02/10/2021: HIP ARTHROPLASTY; Right     Comment:  Procedure: ARTHROPLASTY BIPOLAR HIP (HEMIARTHROPLASTY);               Surgeon: Juanell Fairly, MD;  Location: [facility] ORS;                Service: Orthopedics;  Laterality: Right; No date: TONSILLECTOMY  BMI    Body Mass Index: 18.14 kg/m      Reproductive/Obstetrics                           Anesthesia Physical Anesthesia Plan  ASA: IV  Anesthesia Plan: General   Post-op Pain Management:    Induction: Intravenous  PONV Risk Score and Plan: Treatment may vary due to age or medical condition, TIVA and Ondansetron  Airway Management Planned: Nasal Cannula and Natural Airway  Additional Equipment:   Intra-op Plan:   Post-operative Plan:   Informed Consent: I have reviewed the patients History and Physical, chart, labs and discussed the procedure including the risks, benefits and alternatives for the proposed anesthesia with the  patient or authorized representative who has indicated his/her understanding and acceptance.     Dental Advisory Given  Plan Discussed with: CRNA  Anesthesia Plan Comments:        Anesthesia Quick Evaluation

## 2021-02-21 NOTE — Care Management Important Message (Signed)
Important Message  Patient Details  Name: Melanie White MRN: [id number] Date of Birth: 1946/05/18   Medicare Important Message Given:  Yes  Patient is now out of isolation and I reviewed the Important Message from Medicare(IMM) with her and asked if she would sign the initial IMM.  She was a little shaky and asked if she could sign it next time and I told her that would be fine. I left a copy with the patient.  Olegario Messier A Kandas Oliveto 02/21/2021, 12:09 PM

## 2021-02-21 NOTE — Progress Notes (Signed)
Pt procedure completed. Pt in recovery at this time. Pt received complete bed bath noted redness to sacrum, buttock, perineal area. Barrier cream and sacral barrier pad applied. Ted hose on. Pt comfortable in bed awaiting transport back to unit. Post procedure report was given to shift nurse.

## 2021-02-21 NOTE — Progress Notes (Signed)
Patient confused this AM. Staff unable to reach family for informed consent. Given recent significant GI bleeding, I will deem this procedure (Esophagogastroduodenoscopy) as EMERGENT, with emergency consent per physicians, witnessed by nursing staff.  George Ina, M.D. ABIM Diplomate in Gastroenterology [facility]

## 2021-02-21 NOTE — Plan of Care (Signed)
  Problem: Education: Goal: Verbalization of understanding the information provided (i.e., activity precautions, restrictions, etc) will improve Outcome: Progressing Goal: Individualized Educational Video(s) Outcome: Progressing   Problem: Activity: Goal: Ability to ambulate and perform ADLs will improve Outcome: Progressing   Problem: Clinical Measurements: Goal: Postoperative complications will be avoided or minimized Outcome: Progressing   Problem: Self-Concept: Goal: Ability to maintain and perform role responsibilities to the fullest extent possible will improve Outcome: Progressing   Problem: Pain Management: Goal: Pain level will decrease Outcome: Progressing   Problem: Education: Goal: Knowledge of General Education information will improve Description: Including pain rating scale, medication(s)/side effects and non-pharmacologic comfort measures Outcome: Progressing   Problem: Health Behavior/Discharge Planning: Goal: Ability to manage health-related needs will improve Outcome: Progressing   Problem: Clinical Measurements: Goal: Ability to maintain clinical measurements within normal limits will improve Outcome: Progressing Goal: Will remain free from infection Outcome: Progressing Goal: Diagnostic test results will improve Outcome: Progressing Goal: Respiratory complications will improve Outcome: Progressing Goal: Cardiovascular complication will be avoided Outcome: Progressing   Problem: Activity: Goal: Risk for activity intolerance will decrease Outcome: Progressing   Problem: Coping: Goal: Level of anxiety will decrease Outcome: Progressing   Problem: Elimination: Goal: Will not experience complications related to bowel motility Outcome: Progressing Goal: Will not experience complications related to urinary retention Outcome: Progressing   Problem: Pain Managment: Goal: General experience of comfort will improve Outcome: Progressing   Problem:  Safety: Goal: Ability to remain free from injury will improve Outcome: Progressing   Problem: Skin Integrity: Goal: Risk for impaired skin integrity will decrease Outcome: Progressing   Problem: Education: Goal: Knowledge of risk factors and measures for prevention of condition will improve Outcome: Progressing   Problem: Coping: Goal: Psychosocial and spiritual needs will be supported Outcome: Progressing   Problem: Respiratory: Goal: Will maintain a patent airway Outcome: Progressing Goal: Complications related to the disease process, condition or treatment will be avoided or minimized Outcome: Progressing   Problem: Nutrition: Goal: Adequate nutrition will be maintained Outcome: Not Progressing   Patient has nausea/vomiting and low PO intake.

## 2021-02-21 NOTE — Anesthesia Procedure Notes (Signed)
Date/Time: 02/21/2021 1:25 PM Performed by: Ginger Carne, CRNA Pre-anesthesia Checklist: Patient identified, Emergency Drugs available, Suction available, Patient being monitored and Timeout performed Patient Re-evaluated:Patient Re-evaluated prior to induction Oxygen Delivery Method: Nasal cannula Preoxygenation: Pre-oxygenation with 100% oxygen Induction Type: IV induction

## 2021-02-21 NOTE — Progress Notes (Signed)
OT Cancellation Note  Patient Details Name: Melanie White MRN: [id number] DOB: [birth year]   Cancelled Treatment:    Reason Eval/Treat Not Completed: Patient at procedure or test/ unavailable  Pt OTF at this time for EGD. Will f/u for OT treatment at later date/time as able. Thank you.  Rejeana Brock, MS, OTR/L ascom [phone] 02/21/21, 1:47 PM

## 2021-02-21 NOTE — Transfer of Care (Signed)
Immediate Anesthesia Transfer of Care Note  Patient: Melanie White  Procedure(s) Performed: ESOPHAGOGASTRODUODENOSCOPY (EGD) WITH PROPOFOL (N/A )  Patient Location: PACU  Anesthesia Type:General  Level of Consciousness: sedated  Airway & Oxygen Therapy: Patient connected to nasal cannula oxygen  Post-op Assessment: Report given to RN and Post -op Vital signs reviewed and stable  Post vital signs: Reviewed and stable  Last Vitals:  Vitals Value Taken Time  BP 136/62 02/21/21 1339  Temp 36.5 C 02/21/21 1337  Pulse 100 02/21/21 1339  Resp 18 02/21/21 1339  SpO2 100 % 02/21/21 1339    Last Pain:  Vitals:   02/21/21 1337  TempSrc: Temporal  PainSc: Asleep      Patients Stated Pain Goal: 4 (02/10/21 0715)  Complications: No complications documented.
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
## 2021-02-21 NOTE — Progress Notes (Signed)
PT attempt. Pt off floor for EDG. Also noted to have HgB 6.6 which is outside PT protocol.
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
## 2021-02-22 DIAGNOSIS — K269 Duodenal ulcer, unspecified as acute or chronic, without hemorrhage or perforation: Secondary | ICD-10-CM

## 2021-02-22 DIAGNOSIS — S72011A Unspecified intracapsular fracture of right femur, initial encounter for closed fracture: Secondary | ICD-10-CM | POA: Diagnosis not present

## 2021-02-22 LAB — BASIC METABOLIC PANEL
Anion gap: 11 (ref 5–15)
BUN: 51 mg/dL — ABNORMAL HIGH (ref 8–23)
CO2: 21 mmol/L — ABNORMAL LOW (ref 22–32)
Calcium: 7.9 mg/dL — ABNORMAL LOW (ref 8.9–10.3)
Chloride: 111 mmol/L (ref 98–111)
Creatinine, Ser: 1.33 mg/dL — ABNORMAL HIGH (ref 0.44–1.00)
GFR, Estimated: 41 mL/min — ABNORMAL LOW (ref >60)
Glucose, Bld: 74 mg/dL (ref 70–99)
Potassium: 3.8 mmol/L (ref 3.5–5.1)
Sodium: 143 mmol/L (ref 135–145)

## 2021-02-22 LAB — BPAM RBC
Blood Product Expiration Date: 202204042359
ISSUE DATE / TIME: 202203032135
Unit Type and Rh: 5100

## 2021-02-22 LAB — CBC
HCT: 25.1 % — ABNORMAL LOW (ref 36.0–46.0)
Hemoglobin: 8.1 g/dL — ABNORMAL LOW (ref 12.0–15.0)
MCH: 31.9 pg (ref 26.0–34.0)
MCHC: 32.3 g/dL (ref 30.0–36.0)
MCV: 98.8 fL (ref 80.0–100.0)
Platelets: 334 10*3/uL (ref 150–400)
RBC: 2.54 MIL/uL — ABNORMAL LOW (ref 3.87–5.11)
RDW: 16.5 % — ABNORMAL HIGH (ref 11.5–15.5)
WBC: 24.8 10*3/uL — ABNORMAL HIGH (ref 4.0–10.5)
nRBC: 0.8 % — ABNORMAL HIGH (ref 0.0–0.2)

## 2021-02-22 LAB — ALBUMIN: Albumin: 2.4 g/dL — ABNORMAL LOW (ref 3.5–5.0)

## 2021-02-22 LAB — TYPE AND SCREEN
ABO/RH(D): O POS
Antibody Screen: NEGATIVE
Unit division: 0

## 2021-02-22 LAB — MAGNESIUM: Magnesium: 1.8 mg/dL (ref 1.7–2.4)

## 2021-02-22 LAB — C-REACTIVE PROTEIN: CRP: 3.5 mg/dL — ABNORMAL HIGH (ref <1.0)

## 2021-02-22 MED ORDER — PROSOURCE PLUS PO LIQD
30.0000 mL | Freq: Four times a day (QID) | ORAL | Status: DC
  Administered 2021-02-24 – 2021-03-11 (×38): 30 mL via ORAL
  Filled 2021-02-22 (×71): qty 30

## 2021-02-22 MED ORDER — DEXTROSE 5 % IV SOLN: INTRAVENOUS | Status: DC

## 2021-02-22 MED ORDER — FLUCONAZOLE IN SODIUM CHLORIDE 200-0.9 MG/100ML-% IV SOLN
200.0000 mg | INTRAVENOUS | Status: DC
  Administered 2021-02-22 – 2021-02-26 (×5): 200 mg via INTRAVENOUS
  Filled 2021-02-22 (×5): qty 100

## 2021-02-22 MED ORDER — BLISTEX MEDICATED EX OINT
TOPICAL_OINTMENT | CUTANEOUS | Status: DC | PRN
  Administered 2021-02-22: 1 via TOPICAL
  Filled 2021-02-22: qty 6.3

## 2021-02-22 NOTE — TOC Progression Note (Addendum)
Transition of Care [facility]) - Progression Note    Patient Details  Name: Melanie White MRN: [id number] Date of Birth: [birth year]  Transition of Care [facility]) CM/SW Contact  Liliana Cline, LCSW Phone Number: 02/22/2021, 11:14 AM  Clinical Narrative:   Plan for patient to DC to [facility] when medically ready. [employer] will need to be contacted to update insurance authorization dates when patient is medically ready. TOC will continue to follow.  Expected Discharge Plan: Skilled Nursing Facility Barriers to Discharge: Continued Medical Work up  Expected Discharge Plan and Services Expected Discharge Plan: Skilled Nursing Facility       Living arrangements for the past 2 months: Single Family Home                                       Social Determinants of Health (SDOH) Interventions    Readmission Risk Interventions No flowsheet data found.
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
## 2021-02-22 NOTE — Progress Notes (Addendum)
GI Inpatient Follow-up Note  Subjective:  Patient seen in follow-up for acute post hemorrhagic anemia. She is s/p EGD yesterday afternoon by Dr. Norma Fredrickson which revealed moderately severe erosive esophagitis with no bleeding, gastritis, three non-bleeding cratered duodenal ulcers with adherent clot in first portion of duodenum, and few non-bleeding linear and superficial duodenal ulcers with no stigmata of bleeding in second/third portions of duodenum.  Patient seen and examined this afternoon. She denies any acute overnight events. She reports two BMs today but does not know what color they were. She denies nausea, vomiting, or abdominal pain. She is not hungry and does not want to eat. She reports she will try to eat the broth tomorrow. Hemoglobin 8.1 this morning   Scheduled Inpatient Medications:  . (feeding supplement) PROSource Plus  30 mL Oral QID  . sodium chloride   Intravenous Once  . acidophilus  1 capsule Oral Daily  . amLODipine  10 mg Oral Daily  . atorvastatin  20 mg Oral QHS  . Chlorhexidine Gluconate Cloth  6 each Topical Daily  . dronabinol  2.5 mg Oral BID AC  . influenza vaccine adjuvanted  0.5 mL Intramuscular Tomorrow-1000  . metoprolol tartrate  50 mg Oral BID  . multivitamin with minerals  1 tablet Oral Daily  . scopolamine  1 patch Transdermal Q72H  . sertraline  75 mg Oral Daily  . traMADol  50 mg Oral Q6H    Continuous Inpatient Infusions:   . sodium chloride Stopped (02/11/21 1838)  . dextrose 50 mL/hr at 02/22/21 1255  . fluconazole (DIFLUCAN) IV 200 mg (02/22/21 1257)  . methocarbamol (ROBAXIN) IV    . pantoprozole (PROTONIX) infusion 8 mg/hr (02/21/21 1540)    PRN Inpatient Medications:  sodium chloride, acetaminophen, alum & mag hydroxide-simeth, bisacodyl, calcium carbonate, diphenoxylate-atropine, HYDROcodone-acetaminophen, labetalol, lip balm, menthol-cetylpyridinium, methocarbamol **OR** methocarbamol (ROBAXIN) IV, morphine injection, ondansetron  **OR** ondansetron (ZOFRAN) IV, zolpidem  Review of Systems: Constitutional: Weight is stable.  Eyes: No changes in vision. ENT: No oral lesions, sore throat.  GI: see HPI.  Heme/Lymph: No easy bruising.  CV: No chest pain.  GU: No hematuria.  Integumentary: No rashes.  Neuro: No headaches.  Psych: No depression/anxiety.  Endocrine: No heat/cold intolerance.  Allergic/Immunologic: No urticaria.  Resp: No cough, SOB.  Musculoskeletal: No joint swelling.    Physical Examination: BP 140/73 (BP Location: Right Arm)   Pulse 80   Temp 98.9 F (37.2 C) (Oral)   Resp 17   Ht 5\' 5"  (1.651 m)   Wt 49.4 kg   SpO2 98%   BMI 18.14 kg/m  Gen: NAD, alert and oriented x 4 HEENT: PEERLA, EOMI, Neck: supple, no JVD or thyromegaly Chest: CTA bilaterally, no wheezes, crackles, or other adventitious sounds CV: RRR, no m/g/c/r Abd: soft, NT, ND, +BS in all four quadrants; no HSM, guarding, ridigity, or rebound tenderness Ext: no edema, well perfused with 2+ pulses, Skin: no rash or lesions noted Lymph: no LAD  Data: Lab Results  Component Value Date   WBC 24.8 (H) 02/22/2021   HGB 8.1 (L) 02/22/2021   HCT 25.1 (L) 02/22/2021   MCV 98.8 02/22/2021   PLT 334 02/22/2021   Recent Labs  Lab 02/21/21 0640 02/21/21 1856 02/22/21 0438  HGB 6.6* 6.0* 8.1*   Lab Results  Component Value Date   NA 143 02/22/2021   K 3.8 02/22/2021   CL 111 02/22/2021   CO2 21 (L) 02/22/2021   BUN 51 (H) 02/22/2021   CREATININE 1.33 (  H) 02/22/2021   Lab Results  Component Value Date   ALT 21 02/09/2021   AST 39 02/09/2021   ALKPHOS 74 02/09/2021   BILITOT 1.4 (H) 02/09/2021   No results for input(s): APTT, INR, PTT in the last 168 hours.   EGD 02/21/21: Findings:      Moderately severe esophagitis with no bleeding was found in  the distal esophagus. Biopsies were taken with a cold  forceps for histology.      Scattered moderate inflammation characterized by erosions  and erythema and coffee  ground material was found in  the gastric antrum.      The cardia and gastric fundus were normal on retroflexion.      Three non-bleeding cratered duodenal ulcers with adherent  clot were found in the first portion of the duodenum. The  largest lesion was 7 mm in largest dimension.      Few non-bleeding linear and superficial duodenal ulcers with  no stigmata of bleeding were found in the second portion  of the duodenum and in the third portion of the duodenum.      The exam was otherwise without abnormality. Impression:             - Moderately severe monilial, chronic and erosive  esophagitis with no bleeding. Biopsied. - Gastritis. - Non-bleeding duodenal ulcers with adherent clot. - Non-bleeding duodenal ulcers with no stigmata of bleeding.   Assessment/Plan:  75 y/o female with a PMH of anxiety, depression, HTN, and HLD admitted 02/09/21 for closed displaced fracture of right femoral neck s/p right hip hemiarthroplasty. GI consulted in setting of hematemesis and melena consistent with UGI bleeding.   1. Upper GI Bleed - stable, hemoglobin 8.1 this morning 2. Erosive esophagitis 3. Gastritis - H pylori stool antigen ordered, but not collected 4. Duodenal ulcer disease  5. Severe malnutrition in context of chronic illness 6. Closed displaced fracture of right femoral neck s/p right hip hemiarthroplasty - Dr. Martha Clan following 7. Gastroenteritis - likely 2/2 Hx of COVID-19 virus   - s/p EGD yesterday afternoon showing moderately severe erosive esophagitis with no bleeding, gastritis, three non-bleeding cratered duodenal ulcers with adherent clot in first portion of duodenum, and few non-bleeding linear and superficial duodenal ulcers with no stigmata of bleeding in second/third portions of duodenum.  - Clinically stable with no signs of recurrent melena/hematmesis - Continue to monitor H&H closely and transfuse for hgb <7.0. - Can switch to Pantoprazole 40 mg PO BID x 1 month then  reduce to once daily for an additional 8 weeks - Continue to advance diet as tolerated. If she is not tolerating oral diet, there is tentative plan for Dobhoff placement. Pt reports she will try to eat more tonight. - Dr. Maximino Greenland will follow over the weekend for GI - Pt should follow-up with her primary gastroenterologist upon discharge - Follow-up on H pylori stool testing and EGD biopsies - Anticipate discharge to SNF over the weekend if patient can tolerate oral diet  Please call with questions or concerns.    Jacob Moores, PA-C [facility] [phone] (Cell)
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
## 2021-02-22 NOTE — Progress Notes (Signed)
   02/22/21 1401  Clinical Encounter Type  Visited With Patient  Visit Type Initial  Referral From Nurse  Consult/Referral To Chaplain  Spiritual Encounters  Spiritual Needs Prayer;Emotional  Chaplain Ayomide Purdy visited with Ms. Melanie White, room 1A-136A. Pt stated she do not know why she is in the hospital and the doctors are trying to figure out what is wrong with her. Pt stated she hope they find out soon. I provided reflective listening, ministry of presence and I ended the visit with a prayer.

## 2021-02-22 NOTE — Progress Notes (Signed)
Nutrition Follow-up  DOCUMENTATION CODES:   Severe malnutrition in context of chronic illness  INTERVENTION:  Will discontinue Boost Breeze per patient request.  Provide PROSource Plus po QID, each supplement provides 100 kcal and 15 grams of protein.  Continue MVI po daily.  Recommend placement of small-bore feeding tube (Dobbhoff) for initiation of tube feeds. Patient reports she would like to try to eat one more day and will let MD know her decision tomorrow. If plan is for initiation of tube feeds recommend: -Initiate Osmolite 1.2 Cal at 20 mL/hr and advance by 20 mL/hr every 12 hours to goal rate of 60 mL/hr -Provides 1728 kcal, 80 grams of protein, 1181 mL H2O daily  Monitor magnesium, potassium, and phosphorus daily for at least 3 days, MD to replete as needed, as pt is at risk for refeeding syndrome given prolonged inadequate oral intake.  NUTRITION DIAGNOSIS:   Severe Malnutrition related to social / environmental circumstances (suspected inadequate oral intake) as evidenced by severe fat depletion,severe muscle depletion.  Ongoing.  GOAL:   Patient will meet greater than or equal to 90% of their needs  Not met.  MONITOR:   Supplement acceptance,Labs,Weight trends,PO intake,Skin,I & O's  REASON FOR ASSESSMENT:   Consult Assessment of nutrition requirement/status  ASSESSMENT:   75 year old female with PMHx of anxiety, depression, HTN, HLD admitted with closed displaced fracture of right femoral neck s/p right hip hemiarthroplasty 2/20, also with gastroenteritis, COVID-19 infection, AKI.   Patient developed melena and hematemesis. She underwent EGD on 3/3 that found moderately severe monilial, chronic and erosive esophagitis with no bleeding, gastritis, non-bleeding duodenal ulcers with adherent clog, non-bleeding duodenal ulcers with no stigmata of bleeding.  Met with patient at bedside. She reports her appetite continues to be poor. Prior to EGD she continued  to only have bites of crackers and sips of water and Ginger-ale. She is now on clear liquid diet. She reports she is only taking sips of liquids now. She also does not like the Boost Breeze supplements (has already tried [employer], [employer], and [employer] and did not like any of those). She is willing to try another type of oral nutrition supplement. Discussed prolonged poor PO intake. On Monday patient had reported she did not want a feeding tube. She reports she believes she still does not want a feeding tube but wants to give herself one more day to try to eat and she will let MD know her decision tomorrow.   Medications reviewed and include: acidophilus 1 capsule daily, Marinol 2.5 mg BID before lunch and supper, MVI daily, Ultram, D5W at 50 mL/hr, Diflucan.  Labs reviewed: CO2 21, BUN 51, Creatinine 1.33.  No weight to trend since 2/20  Discussed with RN. Discussed with Attending and GI in secure chat.  Diet Order:   Diet Order            Diet clear liquid Room service appropriate? Yes; Fluid consistency: Thin  Diet effective now                EDUCATION NEEDS:   No education needs have been identified at this time  Skin:  Skin Assessment: Skin Integrity Issues: Skin Integrity Issues:: Stage I,Incisions Stage I: sacrum Incisions: closed incision to right hip  Last BM:  02/22/2021 per chart  Height:   Ht Readings from Last 1 Encounters:  02/09/21 '5\' 5"'  (1.651 m)   Weight:   Wt Readings from Last 1 Encounters:  02/10/21 49.4 kg  BMI:  Body mass index is 18.14 kg/m.  Estimated Nutritional Needs:   Kcal:  1500-1700  Protein:  75-85 grams  Fluid:  1.2-1.5 L/day  Jacklynn Barnacle, MS, RD, LDN Pager number available on Amion

## 2021-02-23 DIAGNOSIS — D649 Anemia, unspecified: Secondary | ICD-10-CM | POA: Diagnosis not present

## 2021-02-23 DIAGNOSIS — E43 Unspecified severe protein-calorie malnutrition: Secondary | ICD-10-CM

## 2021-02-23 DIAGNOSIS — K209 Esophagitis, unspecified without bleeding: Secondary | ICD-10-CM | POA: Diagnosis not present

## 2021-02-23 DIAGNOSIS — S72011A Unspecified intracapsular fracture of right femur, initial encounter for closed fracture: Secondary | ICD-10-CM | POA: Diagnosis not present

## 2021-02-23 LAB — BASIC METABOLIC PANEL
Anion gap: 8 (ref 5–15)
BUN: 40 mg/dL — ABNORMAL HIGH (ref 8–23)
CO2: 24 mmol/L (ref 22–32)
Calcium: 7.8 mg/dL — ABNORMAL LOW (ref 8.9–10.3)
Chloride: 107 mmol/L (ref 98–111)
Creatinine, Ser: 1.24 mg/dL — ABNORMAL HIGH (ref 0.44–1.00)
GFR, Estimated: 45 mL/min — ABNORMAL LOW (ref >60)
Glucose, Bld: 89 mg/dL (ref 70–99)
Potassium: 2.8 mmol/L — ABNORMAL LOW (ref 3.5–5.1)
Sodium: 139 mmol/L (ref 135–145)

## 2021-02-23 LAB — CBC
HCT: 24.2 % — ABNORMAL LOW (ref 36.0–46.0)
Hemoglobin: 7.7 g/dL — ABNORMAL LOW (ref 12.0–15.0)
MCH: 32 pg (ref 26.0–34.0)
MCHC: 31.8 g/dL (ref 30.0–36.0)
MCV: 100.4 fL — ABNORMAL HIGH (ref 80.0–100.0)
Platelets: 315 10*3/uL (ref 150–400)
RBC: 2.41 MIL/uL — ABNORMAL LOW (ref 3.87–5.11)
RDW: 17 % — ABNORMAL HIGH (ref 11.5–15.5)
WBC: 17.4 10*3/uL — ABNORMAL HIGH (ref 4.0–10.5)
nRBC: 0.7 % — ABNORMAL HIGH (ref 0.0–0.2)

## 2021-02-23 LAB — MAGNESIUM: Magnesium: 1.8 mg/dL (ref 1.7–2.4)

## 2021-02-23 MED ORDER — PANTOPRAZOLE SODIUM 40 MG IV SOLR
40.0000 mg | Freq: Two times a day (BID) | INTRAVENOUS | Status: DC
  Administered 2021-02-23 – 2021-02-24 (×3): 40 mg via INTRAVENOUS
  Filled 2021-02-23 (×3): qty 40

## 2021-02-23 MED ORDER — POTASSIUM CHLORIDE CRYS ER 20 MEQ PO TBCR
40.0000 meq | EXTENDED_RELEASE_TABLET | ORAL | Status: AC
  Administered 2021-02-23 (×2): 40 meq via ORAL
  Filled 2021-02-23 (×2): qty 2
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
## 2021-02-23 NOTE — Progress Notes (Addendum)
   02/23/21 0806  Vitals  Temp 98.1 F (36.7 C)  BP (!) 153/72  MAP (mmHg) 93  BP Location Right Arm  BP Method Automatic  Patient Position (if appropriate) Lying  Pulse Rate 84  Resp 16  MEWS COLOR  MEWS Score Color Green  Oxygen Therapy  SpO2 94 %  O2 Device Room Air  Pain Assessment  Pain Scale 0-10  Pain Score 0  MEWS Score  MEWS Temp 0  MEWS Systolic 0  MEWS Pulse 0  MEWS RR 0  MEWS LOC 0  MEWS Score 0  Provider Notification  Provider Name/Title Dr. Fran Lowes  Date Provider Notified 02/23/21  Time Provider Notified [phone]  Notification Type Page  Notification Reason Other (Comment);Critical result  Test performed and critical result potassium:2.8 (Potassium level)  Date Critical Result Received 02/23/21  Time Critical Result Received 0814  Provider response See new orders  Date of Provider Response 02/23/21  Time of Provider Response [phone]   Patient continued with poor food intake. This nurse encouraged and offered alternative  Supplement/food. Pt refused.  Pt also refused ProSourse supplement.

## 2021-02-23 NOTE — Progress Notes (Signed)
PT Cancellation Note  Patient Details Name: PEARLENE TEAT MRN: [id number] DOB: [birth year]   Cancelled Treatment:     PT attempt, pt refused. 2nd attempt today. Pt currently soiled, offered assistance but request female assistance. Max encouragement to get OOB however pt states " I just don't want to do it." Acute PT will continue efforts per current POC. RN notified of pt's unwillingness and need for hygiene care.     Rushie Chestnut 02/23/2021, 2:00 PM

## 2021-02-23 NOTE — Progress Notes (Signed)
OT Cancellation Note  Patient Details Name: Melanie White MRN: [id number] DOB: [birth year]   Cancelled Treatment:    Reason Eval/Treat Not Completed: Patient declined, no reason specified  Pt refuses OT treatment at this time citing nausea. Will f/u at later date/time as able. Thank you.  Rejeana Brock, MS, OTR/L ascom [phone] 02/23/21, 1:20 PM

## 2021-02-24 DIAGNOSIS — S72011A Unspecified intracapsular fracture of right femur, initial encounter for closed fracture: Secondary | ICD-10-CM | POA: Diagnosis not present

## 2021-02-24 DIAGNOSIS — R197 Diarrhea, unspecified: Secondary | ICD-10-CM | POA: Diagnosis not present

## 2021-02-24 DIAGNOSIS — K209 Esophagitis, unspecified without bleeding: Secondary | ICD-10-CM | POA: Diagnosis not present

## 2021-02-24 DIAGNOSIS — D649 Anemia, unspecified: Secondary | ICD-10-CM | POA: Diagnosis not present

## 2021-02-24 LAB — BASIC METABOLIC PANEL
Anion gap: 8 (ref 5–15)
BUN: 33 mg/dL — ABNORMAL HIGH (ref 8–23)
CO2: 23 mmol/L (ref 22–32)
Calcium: 8.1 mg/dL — ABNORMAL LOW (ref 8.9–10.3)
Chloride: 108 mmol/L (ref 98–111)
Creatinine, Ser: 1.5 mg/dL — ABNORMAL HIGH (ref 0.44–1.00)
GFR, Estimated: 36 mL/min — ABNORMAL LOW (ref >60)
Glucose, Bld: 69 mg/dL — ABNORMAL LOW (ref 70–99)
Potassium: 4.1 mmol/L (ref 3.5–5.1)
Sodium: 139 mmol/L (ref 135–145)

## 2021-02-24 LAB — CBC
HCT: 25.2 % — ABNORMAL LOW (ref 36.0–46.0)
Hemoglobin: 8.4 g/dL — ABNORMAL LOW (ref 12.0–15.0)
MCH: 32.9 pg (ref 26.0–34.0)
MCHC: 33.3 g/dL (ref 30.0–36.0)
MCV: 98.8 fL (ref 80.0–100.0)
Platelets: 308 10*3/uL (ref 150–400)
RBC: 2.55 MIL/uL — ABNORMAL LOW (ref 3.87–5.11)
RDW: 18.2 % — ABNORMAL HIGH (ref 11.5–15.5)
WBC: 17.8 10*3/uL — ABNORMAL HIGH (ref 4.0–10.5)
nRBC: 0.4 % — ABNORMAL HIGH (ref 0.0–0.2)

## 2021-02-24 LAB — MAGNESIUM: Magnesium: 1.7 mg/dL (ref 1.7–2.4)

## 2021-02-24 MED ORDER — PANTOPRAZOLE SODIUM 40 MG PO TBEC
40.0000 mg | DELAYED_RELEASE_TABLET | Freq: Two times a day (BID) | ORAL | Status: DC
  Administered 2021-02-25 – 2021-03-03 (×13): 40 mg via ORAL
  Filled 2021-02-24 (×13): qty 1

## 2021-02-24 MED ORDER — ZINC OXIDE 40 % EX OINT
TOPICAL_OINTMENT | Freq: Three times a day (TID) | CUTANEOUS | Status: DC | PRN
  Filled 2021-02-24 (×3): qty 113
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
## 2021-02-25 DIAGNOSIS — S72011A Unspecified intracapsular fracture of right femur, initial encounter for closed fracture: Secondary | ICD-10-CM | POA: Diagnosis not present

## 2021-02-25 LAB — BASIC METABOLIC PANEL
Anion gap: 12 (ref 5–15)
BUN: 38 mg/dL — ABNORMAL HIGH (ref 8–23)
CO2: 21 mmol/L — ABNORMAL LOW (ref 22–32)
Calcium: 8.2 mg/dL — ABNORMAL LOW (ref 8.9–10.3)
Chloride: 109 mmol/L (ref 98–111)
Creatinine, Ser: 1.48 mg/dL — ABNORMAL HIGH (ref 0.44–1.00)
GFR, Estimated: 36 mL/min — ABNORMAL LOW (ref >60)
Glucose, Bld: 71 mg/dL (ref 70–99)
Potassium: 3.6 mmol/L (ref 3.5–5.1)
Sodium: 142 mmol/L (ref 135–145)

## 2021-02-25 LAB — CBC
HCT: 28.1 % — ABNORMAL LOW (ref 36.0–46.0)
Hemoglobin: 9.3 g/dL — ABNORMAL LOW (ref 12.0–15.0)
MCH: 33 pg (ref 26.0–34.0)
MCHC: 33.1 g/dL (ref 30.0–36.0)
MCV: 99.6 fL (ref 80.0–100.0)
Platelets: 335 10*3/uL (ref 150–400)
RBC: 2.82 MIL/uL — ABNORMAL LOW (ref 3.87–5.11)
RDW: 18.2 % — ABNORMAL HIGH (ref 11.5–15.5)
WBC: 17.3 10*3/uL — ABNORMAL HIGH (ref 4.0–10.5)
nRBC: 0.1 % (ref 0.0–0.2)

## 2021-02-25 LAB — MAGNESIUM: Magnesium: 1.9 mg/dL (ref 1.7–2.4)

## 2021-02-25 LAB — SURGICAL PATHOLOGY

## 2021-02-25 NOTE — Progress Notes (Signed)
Patient confused and not following commands. PRN meds given and patinet is now resting. IVF restarted. Awakened minimally for vital signs, turning and repositioning. Needs anticipated. Kept hydrated with water and apple juice. Bed in lowest position. Floor mats in place. Call light within reach. Endorsed to oncoming RN
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
## 2021-02-26 DIAGNOSIS — S72011A Unspecified intracapsular fracture of right femur, initial encounter for closed fracture: Secondary | ICD-10-CM | POA: Diagnosis not present

## 2021-02-26 DIAGNOSIS — N179 Acute kidney failure, unspecified: Secondary | ICD-10-CM | POA: Diagnosis not present

## 2021-02-26 LAB — CBC
HCT: 25.1 % — ABNORMAL LOW (ref 36.0–46.0)
Hemoglobin: 8 g/dL — ABNORMAL LOW (ref 12.0–15.0)
MCH: 32.3 pg (ref 26.0–34.0)
MCHC: 31.9 g/dL (ref 30.0–36.0)
MCV: 101.2 fL — ABNORMAL HIGH (ref 80.0–100.0)
Platelets: 302 10*3/uL (ref 150–400)
RBC: 2.48 MIL/uL — ABNORMAL LOW (ref 3.87–5.11)
RDW: 18.6 % — ABNORMAL HIGH (ref 11.5–15.5)
WBC: 19.1 10*3/uL — ABNORMAL HIGH (ref 4.0–10.5)
nRBC: 0 % (ref 0.0–0.2)

## 2021-02-26 LAB — BASIC METABOLIC PANEL
Anion gap: 14 (ref 5–15)
BUN: 39 mg/dL — ABNORMAL HIGH (ref 8–23)
CO2: 18 mmol/L — ABNORMAL LOW (ref 22–32)
Calcium: 8 mg/dL — ABNORMAL LOW (ref 8.9–10.3)
Chloride: 109 mmol/L (ref 98–111)
Creatinine, Ser: 2.22 mg/dL — ABNORMAL HIGH (ref 0.44–1.00)
GFR, Estimated: 22 mL/min — ABNORMAL LOW (ref >60)
Glucose, Bld: 44 mg/dL — CL (ref 70–99)
Potassium: 3.7 mmol/L (ref 3.5–5.1)
Sodium: 141 mmol/L (ref 135–145)

## 2021-02-26 LAB — MAGNESIUM: Magnesium: 1.8 mg/dL (ref 1.7–2.4)

## 2021-02-26 LAB — H. PYLORI ANTIGEN, STOOL: H. Pylori Stool Ag, Eia: NEGATIVE

## 2021-02-26 LAB — PROCALCITONIN: Procalcitonin: 0.3 ng/mL

## 2021-02-26 MED ORDER — DEXTROSE 5 % IV SOLN: INTRAVENOUS | Status: DC

## 2021-02-26 MED ORDER — FLUCONAZOLE 100 MG PO TABS
200.0000 mg | ORAL_TABLET | Freq: Every day | ORAL | Status: DC
  Filled 2021-02-26: qty 2

## 2021-02-26 MED ORDER — FLUCONAZOLE 100 MG PO TABS: 200.0000 mg | ORAL_TABLET | Freq: Every day | ORAL | Status: DC
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
## 2021-02-26 NOTE — Progress Notes (Signed)
PT Cancellation Note  Patient Details Name: JOLEEN STUCKERT MRN: [id number] DOB: 1946/01/20   Cancelled Treatment:     PT attempt. Pt requested therapist return at 3pm today. Will return and continue to follow per POC.   Rushie Chestnut 02/26/2021, 9:51 AM
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
## 2021-02-27 DIAGNOSIS — S72011A Unspecified intracapsular fracture of right femur, initial encounter for closed fracture: Secondary | ICD-10-CM | POA: Diagnosis not present

## 2021-02-27 LAB — BASIC METABOLIC PANEL
Anion gap: 9 (ref 5–15)
BUN: 37 mg/dL — ABNORMAL HIGH (ref 8–23)
CO2: 21 mmol/L — ABNORMAL LOW (ref 22–32)
Calcium: 7.6 mg/dL — ABNORMAL LOW (ref 8.9–10.3)
Chloride: 104 mmol/L (ref 98–111)
Creatinine, Ser: 2.26 mg/dL — ABNORMAL HIGH (ref 0.44–1.00)
GFR, Estimated: 22 mL/min — ABNORMAL LOW (ref >60)
Glucose, Bld: 120 mg/dL — ABNORMAL HIGH (ref 70–99)
Potassium: 3.1 mmol/L — ABNORMAL LOW (ref 3.5–5.1)
Sodium: 134 mmol/L — ABNORMAL LOW (ref 135–145)

## 2021-02-27 LAB — MAGNESIUM: Magnesium: 1.8 mg/dL (ref 1.7–2.4)

## 2021-02-27 LAB — GLUCOSE, CAPILLARY
Glucose-Capillary: 104 mg/dL — ABNORMAL HIGH (ref 70–99)
Glucose-Capillary: 117 mg/dL — ABNORMAL HIGH (ref 70–99)
Glucose-Capillary: 95 mg/dL (ref 70–99)
Glucose-Capillary: 129 mg/dL — ABNORMAL HIGH (ref 70–99)
Glucose-Capillary: 126 mg/dL — ABNORMAL HIGH (ref 70–99)

## 2021-02-27 LAB — PROCALCITONIN: Procalcitonin: 0.17 ng/mL

## 2021-02-27 LAB — CBC
HCT: 26 % — ABNORMAL LOW (ref 36.0–46.0)
Hemoglobin: 8.6 g/dL — ABNORMAL LOW (ref 12.0–15.0)
MCH: 32.3 pg (ref 26.0–34.0)
MCHC: 33.1 g/dL (ref 30.0–36.0)
MCV: 97.7 fL (ref 80.0–100.0)
Platelets: 276 10*3/uL (ref 150–400)
RBC: 2.66 MIL/uL — ABNORMAL LOW (ref 3.87–5.11)
RDW: 18.1 % — ABNORMAL HIGH (ref 11.5–15.5)
WBC: 18.4 10*3/uL — ABNORMAL HIGH (ref 4.0–10.5)
nRBC: 0 % (ref 0.0–0.2)

## 2021-02-27 LAB — C DIFFICILE (CDIFF) QUICK SCRN (NO PCR REFLEX)
C Diff antigen: POSITIVE — AB
C Diff toxin: NEGATIVE

## 2021-02-27 LAB — CLOSTRIDIUM DIFFICILE BY PCR, REFLEXED: Toxigenic C. Difficile by PCR: POSITIVE — AB

## 2021-02-27 MED ORDER — POTASSIUM CHLORIDE CRYS ER 20 MEQ PO TBCR
40.0000 meq | EXTENDED_RELEASE_TABLET | Freq: Once | ORAL | Status: AC
  Administered 2021-02-27: 40 meq via ORAL
  Filled 2021-02-27: qty 2

## 2021-02-27 MED ORDER — MAGNESIUM SULFATE 2 GM/50ML IV SOLN
2.0000 g | Freq: Once | INTRAVENOUS | Status: AC
  Administered 2021-02-27: 2 g via INTRAVENOUS
  Filled 2021-02-27: qty 50

## 2021-02-27 MED ORDER — DEXTROSE-NACL 5-0.9 % IV SOLN: INTRAVENOUS | Status: DC

## 2021-02-27 MED ORDER — VANCOMYCIN 50 MG/ML ORAL SOLUTION
125.0000 mg | Freq: Four times a day (QID) | ORAL | Status: AC
  Administered 2021-02-27 – 2021-03-08 (×35): 125 mg via ORAL
  Filled 2021-02-27 (×43): qty 2.5

## 2021-02-27 MED ORDER — METRONIDAZOLE IN NACL 5-0.79 MG/ML-% IV SOLN
500.0000 mg | Freq: Three times a day (TID) | INTRAVENOUS | Status: DC
  Administered 2021-02-27: 500 mg via INTRAVENOUS
  Filled 2021-02-27 (×3): qty 100

## 2021-02-27 MED ORDER — SODIUM CHLORIDE 0.9 % IV SOLN: INTRAVENOUS | Status: DC
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
## 2021-02-27 NOTE — TOC Progression Note (Signed)
Transition of Care [facility]) - Progression Note    Patient Details  Name: Melanie White MRN: [id number] Date of Birth: 02/10/1946  [facility] [place] CM/SW Contact  Liliana Cline, LCSW Phone Number: 02/27/2021, 8:58 AM  Clinical Narrative:   TOC continues to follow for SNF placement when patient is medically ready for discharge. Will need to update insurance auth when patient is close to being ready for discharge.    Expected Discharge Plan: Skilled Nursing Facility Barriers to Discharge: Continued Medical Work up  Expected Discharge Plan and Services Expected Discharge Plan: Skilled Nursing Facility       Living arrangements for the past 2 months: Single Family Home                                       Social Determinants of Health (SDOH) Interventions    Readmission Risk Interventions No flowsheet data found.

## 2021-02-27 NOTE — Plan of Care (Signed)
  Problem: Education: Goal: Verbalization of understanding the information provided (i.e., activity precautions, restrictions, etc) will improve Outcome: Progressing Goal: Individualized Educational Video(s) Outcome: Progressing   Problem: Activity: Goal: Ability to ambulate and perform ADLs will improve Outcome: Progressing   Problem: Clinical Measurements: Goal: Postoperative complications will be avoided or minimized Outcome: Progressing   Problem: Self-Concept: Goal: Ability to maintain and perform role responsibilities to the fullest extent possible will improve Outcome: Progressing   Problem: Pain Management: Goal: Pain level will decrease Outcome: Progressing   Problem: Education: Goal: Knowledge of General Education information will improve Description: Including pain rating scale, medication(s)/side effects and non-pharmacologic comfort measures Outcome: Progressing   Problem: Health Behavior/Discharge Planning: Goal: Ability to manage health-related needs will improve Outcome: Progressing   Problem: Clinical Measurements: Goal: Ability to maintain clinical measurements within normal limits will improve Outcome: Progressing Goal: Will remain free from infection Outcome: Progressing Goal: Diagnostic test results will improve Outcome: Progressing Goal: Respiratory complications will improve Outcome: Progressing Goal: Cardiovascular complication will be avoided Outcome: Progressing   Problem: Activity: Goal: Risk for activity intolerance will decrease Outcome: Progressing   Problem: Nutrition: Goal: Adequate nutrition will be maintained Outcome: Progressing   Problem: Coping: Goal: Level of anxiety will decrease Outcome: Progressing   Problem: Elimination: Goal: Will not experience complications related to bowel motility Outcome: Progressing Goal: Will not experience complications related to urinary retention Outcome: Progressing   Problem: Pain  Managment: Goal: General experience of comfort will improve Outcome: Progressing   Problem: Safety: Goal: Ability to remain free from injury will improve Outcome: Progressing   Problem: Skin Integrity: Goal: Risk for impaired skin integrity will decrease Outcome: Progressing   Problem: Education: Goal: Knowledge of risk factors and measures for prevention of condition will improve Outcome: Progressing   Problem: Coping: Goal: Psychosocial and spiritual needs will be supported Outcome: Progressing   Problem: Respiratory: Goal: Will maintain a patent airway Outcome: Progressing Goal: Complications related to the disease process, condition or treatment will be avoided or minimized Outcome: Progressing   

## 2021-02-28 DIAGNOSIS — S72011A Unspecified intracapsular fracture of right femur, initial encounter for closed fracture: Secondary | ICD-10-CM | POA: Diagnosis not present

## 2021-02-28 LAB — CBC WITH DIFFERENTIAL/PLATELET
Abs Immature Granulocytes: 0.13 10*3/uL — ABNORMAL HIGH (ref 0.00–0.07)
Basophils Absolute: 0 10*3/uL (ref 0.0–0.1)
Basophils Relative: 0 %
Eosinophils Absolute: 0 10*3/uL (ref 0.0–0.5)
Eosinophils Relative: 0 %
HCT: 26 % — ABNORMAL LOW (ref 36.0–46.0)
Hemoglobin: 8.4 g/dL — ABNORMAL LOW (ref 12.0–15.0)
Immature Granulocytes: 1 %
Lymphocytes Relative: 3 %
Lymphs Abs: 0.5 10*3/uL — ABNORMAL LOW (ref 0.7–4.0)
MCH: 32.2 pg (ref 26.0–34.0)
MCHC: 32.3 g/dL (ref 30.0–36.0)
MCV: 99.6 fL (ref 80.0–100.0)
Monocytes Absolute: 0.7 10*3/uL (ref 0.1–1.0)
Monocytes Relative: 4 %
Neutro Abs: 15.5 10*3/uL — ABNORMAL HIGH (ref 1.7–7.7)
Neutrophils Relative %: 92 %
Platelets: 291 10*3/uL (ref 150–400)
RBC: 2.61 MIL/uL — ABNORMAL LOW (ref 3.87–5.11)
RDW: 18.1 % — ABNORMAL HIGH (ref 11.5–15.5)
WBC: 16.8 10*3/uL — ABNORMAL HIGH (ref 4.0–10.5)
nRBC: 0 % (ref 0.0–0.2)

## 2021-02-28 LAB — BASIC METABOLIC PANEL
Anion gap: 8 (ref 5–15)
BUN: 34 mg/dL — ABNORMAL HIGH (ref 8–23)
CO2: 21 mmol/L — ABNORMAL LOW (ref 22–32)
Calcium: 7.7 mg/dL — ABNORMAL LOW (ref 8.9–10.3)
Chloride: 109 mmol/L (ref 98–111)
Creatinine, Ser: 2.63 mg/dL — ABNORMAL HIGH (ref 0.44–1.00)
GFR, Estimated: 18 mL/min — ABNORMAL LOW (ref >60)
Glucose, Bld: 126 mg/dL — ABNORMAL HIGH (ref 70–99)
Potassium: 4.4 mmol/L (ref 3.5–5.1)
Sodium: 138 mmol/L (ref 135–145)

## 2021-02-28 LAB — MAGNESIUM: Magnesium: 2.3 mg/dL (ref 1.7–2.4)

## 2021-02-28 LAB — GLUCOSE, CAPILLARY
Glucose-Capillary: 106 mg/dL — ABNORMAL HIGH (ref 70–99)
Glucose-Capillary: 96 mg/dL (ref 70–99)

## 2021-02-28 LAB — PROCALCITONIN: Procalcitonin: 0.32 ng/mL

## 2021-02-28 MED ORDER — CHOLESTYRAMINE 4 G PO PACK
4.0000 g | PACK | Freq: Two times a day (BID) | ORAL | Status: DC
  Administered 2021-02-28 – 2021-03-07 (×13): 4 g via ORAL
  Filled 2021-02-28 (×16): qty 1

## 2021-02-28 NOTE — Progress Notes (Signed)
Nutrition Follow-up  DOCUMENTATION CODES:   Severe malnutrition in context of chronic illness  INTERVENTION:  Continue PROSource Plus po QID, each supplement provides 100 kcal and 15 grams of protein.  Continue MVI po daily.  If pt agreeable to small-bore feeding tube for initiation of tube feeds, recommend  -Initiate Osmolite 1.2 Cal at 20 mL/hr and advance by 20 mL/hr every 12 hours to goal rate of 60 mL/hr -Provides 1728 kcal, 80 grams of protein, 1181 mL H2O daily  Continue to monitor magnesium, potassium, and phosphorus daily for at least 3 days, MD to replete as needed, as pt is at risk for refeeding syndrome given prolonged inadequate oral intake.  NUTRITION DIAGNOSIS:   Severe Malnutrition related to social / environmental circumstances (suspected inadequate oral intake) as evidenced by severe fat depletion,severe muscle depletion. -ongoing  GOAL:   Patient will meet greater than or equal to 90% of their needs -unmet  MONITOR:   Supplement acceptance,Labs,Weight trends,PO intake,Skin,I & O's  REASON FOR ASSESSMENT:   Consult Assessment of nutrition requirement/status  ASSESSMENT:   75 year old female with PMHx of anxiety, depression, HTN, HLD admitted with closed displaced fracture of right femoral neck s/p right hip hemiarthroplasty 2/20, also with gastroenteritis, COVID-19 infection, AKI.  RD working remotely.  Pt is s/p right hip hemiarthroplasty on 2/20. Postoperative course has been complicated by upper GI bleed, sepsis, acute AKI and C. difficle  RD attempted to contact pt via phone for nutrition follow-up, however she did not answer. Pt has had poor appetite throughout admission. No documented meals since DYS 2 diet advancement on 3/8. Noted 0% of clears x 2 documented intakes on 3/6 and on 3/7. Per MD note, pt reports tolerating more oral intake today. She is receiving ProSource Plus supplement four times daily, accepting ~60 % of the time. Pt has  previously tried [employer], [employer], [employer], as well as [employer] and does not like any of them. She has refused Dobhoff placement for tube feeds. Pt is receiving appetite stimulant.  No new weights to trend this admission, will order daily weights.  Medications reviewed and include: Risaquad, Questran, Marinol, Protonix, MVI, Zoloft, Vancomycin, Scopolamine patch, Zoloft  IVF: D5 NaCl @ 75 ml/hr  Labs: CBGs 73,220,254, BUN 34 (H), Cr 2.63 (H), WBC 16.8 (H), Hgb 8.4 (L), HCT 26 (L), K 4.4 (WNL), Mg 2.3 (WNL)   Diet Order:   Diet Order            DIET DYS 2 Room service appropriate? Yes; Fluid consistency: Thin  Diet effective now                 EDUCATION NEEDS:   No education needs have been identified at this time  Skin:  Skin Assessment: Skin Integrity Issues: Skin Integrity Issues:: Stage I,Incisions Stage I: sacrum Incisions: closed incision to right hip  Last BM:  3/10 type 5 small  Height:   Ht Readings from Last 1 Encounters:  02/09/21 5\' 5"  (1.651 m)    Weight:   Wt Readings from Last 1 Encounters:  02/10/21 49.4 kg    BMI:  Body mass index is 18.14 kg/m.  Estimated Nutritional Needs:   Kcal:  1500-1700  Protein:  75-85 grams  Fluid:  1.2-1.5 L/day   02/12/21, RD, LDN Clinical Nutrition After Hours/Weekend Pager # in Amion
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
## 2021-03-01 ENCOUNTER — Inpatient Hospital Stay: Payer: Medicare Other

## 2021-03-01 DIAGNOSIS — S72011A Unspecified intracapsular fracture of right femur, initial encounter for closed fracture: Secondary | ICD-10-CM | POA: Diagnosis not present

## 2021-03-01 LAB — BASIC METABOLIC PANEL
Anion gap: 9 (ref 5–15)
BUN: 29 mg/dL — ABNORMAL HIGH (ref 8–23)
CO2: 19 mmol/L — ABNORMAL LOW (ref 22–32)
Calcium: 7.8 mg/dL — ABNORMAL LOW (ref 8.9–10.3)
Chloride: 112 mmol/L — ABNORMAL HIGH (ref 98–111)
Creatinine, Ser: 2.61 mg/dL — ABNORMAL HIGH (ref 0.44–1.00)
GFR, Estimated: 18 mL/min — ABNORMAL LOW (ref >60)
Glucose, Bld: 85 mg/dL (ref 70–99)
Potassium: 3.4 mmol/L — ABNORMAL LOW (ref 3.5–5.1)
Sodium: 140 mmol/L (ref 135–145)

## 2021-03-01 LAB — CBC WITH DIFFERENTIAL/PLATELET
Abs Immature Granulocytes: 0.1 10*3/uL — ABNORMAL HIGH (ref 0.00–0.07)
Basophils Absolute: 0 10*3/uL (ref 0.0–0.1)
Basophils Relative: 0 %
Eosinophils Absolute: 0 10*3/uL (ref 0.0–0.5)
Eosinophils Relative: 0 %
HCT: 28 % — ABNORMAL LOW (ref 36.0–46.0)
Hemoglobin: 9.2 g/dL — ABNORMAL LOW (ref 12.0–15.0)
Immature Granulocytes: 1 %
Lymphocytes Relative: 2 %
Lymphs Abs: 0.4 10*3/uL — ABNORMAL LOW (ref 0.7–4.0)
MCH: 32.4 pg (ref 26.0–34.0)
MCHC: 32.9 g/dL (ref 30.0–36.0)
MCV: 98.6 fL (ref 80.0–100.0)
Monocytes Absolute: 0.6 10*3/uL (ref 0.1–1.0)
Monocytes Relative: 3 %
Neutro Abs: 18 10*3/uL — ABNORMAL HIGH (ref 1.7–7.7)
Neutrophils Relative %: 94 %
Platelets: 298 10*3/uL (ref 150–400)
RBC: 2.84 MIL/uL — ABNORMAL LOW (ref 3.87–5.11)
RDW: 18.6 % — ABNORMAL HIGH (ref 11.5–15.5)
WBC: 19.2 10*3/uL — ABNORMAL HIGH (ref 4.0–10.5)
nRBC: 0 % (ref 0.0–0.2)

## 2021-03-01 LAB — GLUCOSE, CAPILLARY
Glucose-Capillary: 95 mg/dL (ref 70–99)
Glucose-Capillary: 89 mg/dL (ref 70–99)
Glucose-Capillary: 105 mg/dL — ABNORMAL HIGH (ref 70–99)
Glucose-Capillary: 130 mg/dL — ABNORMAL HIGH (ref 70–99)

## 2021-03-01 LAB — MAGNESIUM: Magnesium: 2.1 mg/dL (ref 1.7–2.4)
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
## 2021-03-02 DIAGNOSIS — S72011A Unspecified intracapsular fracture of right femur, initial encounter for closed fracture: Secondary | ICD-10-CM | POA: Diagnosis not present

## 2021-03-02 LAB — CBC WITH DIFFERENTIAL/PLATELET
Abs Immature Granulocytes: 0.09 10*3/uL — ABNORMAL HIGH (ref 0.00–0.07)
Basophils Absolute: 0 10*3/uL (ref 0.0–0.1)
Basophils Relative: 0 %
Eosinophils Absolute: 0 10*3/uL (ref 0.0–0.5)
Eosinophils Relative: 0 %
HCT: 26.9 % — ABNORMAL LOW (ref 36.0–46.0)
Hemoglobin: 8.6 g/dL — ABNORMAL LOW (ref 12.0–15.0)
Immature Granulocytes: 1 %
Lymphocytes Relative: 4 %
Lymphs Abs: 0.6 10*3/uL — ABNORMAL LOW (ref 0.7–4.0)
MCH: 31.9 pg (ref 26.0–34.0)
MCHC: 32 g/dL (ref 30.0–36.0)
MCV: 99.6 fL (ref 80.0–100.0)
Monocytes Absolute: 0.5 10*3/uL (ref 0.1–1.0)
Monocytes Relative: 3 %
Neutro Abs: 14.3 10*3/uL — ABNORMAL HIGH (ref 1.7–7.7)
Neutrophils Relative %: 92 %
Platelets: 284 10*3/uL (ref 150–400)
RBC: 2.7 MIL/uL — ABNORMAL LOW (ref 3.87–5.11)
RDW: 18.7 % — ABNORMAL HIGH (ref 11.5–15.5)
WBC: 15.5 10*3/uL — ABNORMAL HIGH (ref 4.0–10.5)
nRBC: 0 % (ref 0.0–0.2)

## 2021-03-02 LAB — BASIC METABOLIC PANEL
Anion gap: 8 (ref 5–15)
BUN: 31 mg/dL — ABNORMAL HIGH (ref 8–23)
CO2: 18 mmol/L — ABNORMAL LOW (ref 22–32)
Calcium: 7.6 mg/dL — ABNORMAL LOW (ref 8.9–10.3)
Chloride: 118 mmol/L — ABNORMAL HIGH (ref 98–111)
Creatinine, Ser: 2.52 mg/dL — ABNORMAL HIGH (ref 0.44–1.00)
GFR, Estimated: 19 mL/min — ABNORMAL LOW (ref >60)
Glucose, Bld: 99 mg/dL (ref 70–99)
Potassium: 2.9 mmol/L — ABNORMAL LOW (ref 3.5–5.1)
Sodium: 144 mmol/L (ref 135–145)

## 2021-03-02 LAB — GLUCOSE, CAPILLARY
Glucose-Capillary: 104 mg/dL — ABNORMAL HIGH (ref 70–99)
Glucose-Capillary: 105 mg/dL — ABNORMAL HIGH (ref 70–99)
Glucose-Capillary: 91 mg/dL (ref 70–99)
Glucose-Capillary: 92 mg/dL (ref 70–99)

## 2021-03-02 LAB — MAGNESIUM: Magnesium: 1.8 mg/dL (ref 1.7–2.4)

## 2021-03-02 MED ORDER — POTASSIUM CHLORIDE CRYS ER 20 MEQ PO TBCR
40.0000 meq | EXTENDED_RELEASE_TABLET | Freq: Once | ORAL | Status: AC
  Administered 2021-03-02: 40 meq via ORAL
  Filled 2021-03-02: qty 2

## 2021-03-02 MED ORDER — POTASSIUM CHLORIDE 10 MEQ/100ML IV SOLN
10.0000 meq | INTRAVENOUS | Status: AC
  Administered 2021-03-02 (×4): 10 meq via INTRAVENOUS
  Filled 2021-03-02 (×3): qty 100
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
## 2021-03-02 NOTE — Plan of Care (Signed)
  Problem: Education: Goal: Verbalization of understanding the information provided (i.e., activity precautions, restrictions, etc) will improve Outcome: Progressing Goal: Individualized Educational Video(s) Outcome: Progressing   Problem: Activity: Goal: Ability to ambulate and perform ADLs will improve Outcome: Progressing   Problem: Clinical Measurements: Goal: Postoperative complications will be avoided or minimized Outcome: Progressing   Problem: Self-Concept: Goal: Ability to maintain and perform role responsibilities to the fullest extent possible will improve Outcome: Progressing   Problem: Pain Management: Goal: Pain level will decrease Outcome: Progressing   Problem: Education: Goal: Knowledge of General Education information will improve Description: Including pain rating scale, medication(s)/side effects and non-pharmacologic comfort measures Outcome: Progressing   Problem: Health Behavior/Discharge Planning: Goal: Ability to manage health-related needs will improve Outcome: Progressing   Problem: Clinical Measurements: Goal: Ability to maintain clinical measurements within normal limits will improve Outcome: Progressing Goal: Will remain free from infection Outcome: Progressing Goal: Diagnostic test results will improve Outcome: Progressing Goal: Respiratory complications will improve Outcome: Progressing Goal: Cardiovascular complication will be avoided Outcome: Progressing   Problem: Activity: Goal: Risk for activity intolerance will decrease Outcome: Progressing   Problem: Nutrition: Goal: Adequate nutrition will be maintained Outcome: Progressing   Problem: Coping: Goal: Level of anxiety will decrease Outcome: Progressing   Problem: Elimination: Goal: Will not experience complications related to bowel motility Outcome: Progressing Goal: Will not experience complications related to urinary retention Outcome: Progressing   Problem: Pain  Managment: Goal: General experience of comfort will improve Outcome: Progressing   Problem: Safety: Goal: Ability to remain free from injury will improve Outcome: Progressing   Problem: Skin Integrity: Goal: Risk for impaired skin integrity will decrease Outcome: Progressing   Problem: Education: Goal: Knowledge of risk factors and measures for prevention of condition will improve Outcome: Progressing   Problem: Coping: Goal: Psychosocial and spiritual needs will be supported Outcome: Progressing   Problem: Respiratory: Goal: Will maintain a patent airway Outcome: Progressing Goal: Complications related to the disease process, condition or treatment will be avoided or minimized Outcome: Progressing   

## 2021-03-02 NOTE — Plan of Care (Signed)
  Problem: Education: Goal: Verbalization of understanding the information provided (i.e., activity precautions, restrictions, etc) will improve Outcome: Progressing   Problem: Activity: Goal: Ability to ambulate and perform ADLs will improve Outcome: Progressing   Problem: Clinical Measurements: Goal: Postoperative complications will be avoided or minimized Outcome: Progressing   Problem: Self-Concept: Goal: Ability to maintain and perform role responsibilities to the fullest extent possible will improve Outcome: Progressing   Problem: Pain Management: Goal: Pain level will decrease Outcome: Progressing   Problem: Education: Goal: Knowledge of General Education information will improve Description: Including pain rating scale, medication(s)/side effects and non-pharmacologic comfort measures Outcome: Progressing   Problem: Health Behavior/Discharge Planning: Goal: Ability to manage health-related needs will improve Outcome: Progressing   Problem: Clinical Measurements: Goal: Ability to maintain clinical measurements within normal limits will improve Outcome: Progressing Goal: Will remain free from infection Outcome: Progressing Goal: Diagnostic test results will improve Outcome: Progressing Goal: Cardiovascular complication will be avoided Outcome: Progressing   Problem: Activity: Goal: Risk for activity intolerance will decrease Outcome: Progressing   Problem: Nutrition: Goal: Adequate nutrition will be maintained Outcome: Progressing   Problem: Coping: Goal: Level of anxiety will decrease Outcome: Progressing   Problem: Elimination: Goal: Will not experience complications related to bowel motility Outcome: Progressing Goal: Will not experience complications related to urinary retention Outcome: Progressing   Problem: Pain Managment: Goal: General experience of comfort will improve Outcome: Progressing   Problem: Safety: Goal: Ability to remain free  from injury will improve Outcome: Progressing   Problem: Skin Integrity: Goal: Risk for impaired skin integrity will decrease Outcome: Progressing   Problem: Education: Goal: Knowledge of risk factors and measures for prevention of condition will improve Outcome: Progressing   Problem: Coping: Goal: Psychosocial and spiritual needs will be supported Outcome: Progressing   Problem: Respiratory: Goal: Will maintain a patent airway Outcome: Progressing Goal: Complications related to the disease process, condition or treatment will be avoided or minimized Outcome: Progressing
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
## 2021-03-03 DIAGNOSIS — S72011A Unspecified intracapsular fracture of right femur, initial encounter for closed fracture: Secondary | ICD-10-CM | POA: Diagnosis not present

## 2021-03-03 LAB — MAGNESIUM: Magnesium: 1.9 mg/dL (ref 1.7–2.4)

## 2021-03-03 LAB — GLUCOSE, CAPILLARY
Glucose-Capillary: 70 mg/dL (ref 70–99)
Glucose-Capillary: 75 mg/dL (ref 70–99)
Glucose-Capillary: 81 mg/dL (ref 70–99)
Glucose-Capillary: 88 mg/dL (ref 70–99)

## 2021-03-03 LAB — BASIC METABOLIC PANEL
Anion gap: 5 (ref 5–15)
BUN: 31 mg/dL — ABNORMAL HIGH (ref 8–23)
CO2: 18 mmol/L — ABNORMAL LOW (ref 22–32)
Calcium: 7.9 mg/dL — ABNORMAL LOW (ref 8.9–10.3)
Chloride: 121 mmol/L — ABNORMAL HIGH (ref 98–111)
Creatinine, Ser: 2.6 mg/dL — ABNORMAL HIGH (ref 0.44–1.00)
GFR, Estimated: 19 mL/min — ABNORMAL LOW (ref >60)
Glucose, Bld: 71 mg/dL (ref 70–99)
Potassium: 4.8 mmol/L (ref 3.5–5.1)
Sodium: 144 mmol/L (ref 135–145)

## 2021-03-03 LAB — CBC WITH DIFFERENTIAL/PLATELET
Abs Immature Granulocytes: 0.07 10*3/uL (ref 0.00–0.07)
Basophils Absolute: 0 10*3/uL (ref 0.0–0.1)
Basophils Relative: 0 %
Eosinophils Absolute: 0 10*3/uL (ref 0.0–0.5)
Eosinophils Relative: 0 %
HCT: 27.7 % — ABNORMAL LOW (ref 36.0–46.0)
Hemoglobin: 9 g/dL — ABNORMAL LOW (ref 12.0–15.0)
Immature Granulocytes: 1 %
Lymphocytes Relative: 5 %
Lymphs Abs: 0.7 10*3/uL (ref 0.7–4.0)
MCH: 33 pg (ref 26.0–34.0)
MCHC: 32.5 g/dL (ref 30.0–36.0)
MCV: 101.5 fL — ABNORMAL HIGH (ref 80.0–100.0)
Monocytes Absolute: 0.5 10*3/uL (ref 0.1–1.0)
Monocytes Relative: 3 %
Neutro Abs: 12.8 10*3/uL — ABNORMAL HIGH (ref 1.7–7.7)
Neutrophils Relative %: 91 %
Platelets: 244 10*3/uL (ref 150–400)
RBC: 2.73 MIL/uL — ABNORMAL LOW (ref 3.87–5.11)
RDW: 19.5 % — ABNORMAL HIGH (ref 11.5–15.5)
WBC: 14.1 10*3/uL — ABNORMAL HIGH (ref 4.0–10.5)
nRBC: 0 % (ref 0.0–0.2)

## 2021-03-03 MED ORDER — MAGNESIUM SULFATE 2 GM/50ML IV SOLN: 2.0000 g | Freq: Once | INTRAVENOUS | Status: DC

## 2021-03-03 NOTE — Plan of Care (Signed)
  Problem: Education: Goal: Verbalization of understanding the information provided (i.e., activity precautions, restrictions, etc) will improve Outcome: Progressing Goal: Individualized Educational Video(s) Outcome: Progressing   Problem: Activity: Goal: Ability to ambulate and perform ADLs will improve Outcome: Progressing   Problem: Clinical Measurements: Goal: Postoperative complications will be avoided or minimized Outcome: Progressing   Problem: Self-Concept: Goal: Ability to maintain and perform role responsibilities to the fullest extent possible will improve Outcome: Progressing   Problem: Pain Management: Goal: Pain level will decrease Outcome: Progressing   Problem: Education: Goal: Knowledge of General Education information will improve Description: Including pain rating scale, medication(s)/side effects and non-pharmacologic comfort measures Outcome: Progressing   Problem: Health Behavior/Discharge Planning: Goal: Ability to manage health-related needs will improve Outcome: Progressing   Problem: Clinical Measurements: Goal: Ability to maintain clinical measurements within normal limits will improve Outcome: Progressing Goal: Will remain free from infection Outcome: Progressing Goal: Diagnostic test results will improve Outcome: Progressing Goal: Respiratory complications will improve Outcome: Progressing Goal: Cardiovascular complication will be avoided Outcome: Progressing   Problem: Activity: Goal: Risk for activity intolerance will decrease Outcome: Progressing   Problem: Nutrition: Goal: Adequate nutrition will be maintained Outcome: Progressing   Problem: Coping: Goal: Level of anxiety will decrease Outcome: Progressing   Problem: Elimination: Goal: Will not experience complications related to bowel motility Outcome: Progressing Goal: Will not experience complications related to urinary retention Outcome: Progressing   Problem: Pain  Managment: Goal: General experience of comfort will improve Outcome: Progressing   Problem: Safety: Goal: Ability to remain free from injury will improve Outcome: Progressing   Problem: Skin Integrity: Goal: Risk for impaired skin integrity will decrease Outcome: Progressing   Problem: Education: Goal: Knowledge of risk factors and measures for prevention of condition will improve Outcome: Progressing   Problem: Coping: Goal: Psychosocial and spiritual needs will be supported Outcome: Progressing   Problem: Respiratory: Goal: Will maintain a patent airway Outcome: Progressing Goal: Complications related to the disease process, condition or treatment will be avoided or minimized Outcome: Progressing
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
## 2021-03-03 NOTE — Progress Notes (Signed)
Handoff was created in error

## 2021-03-04 DIAGNOSIS — S72011A Unspecified intracapsular fracture of right femur, initial encounter for closed fracture: Secondary | ICD-10-CM | POA: Diagnosis not present

## 2021-03-04 DIAGNOSIS — N179 Acute kidney failure, unspecified: Secondary | ICD-10-CM | POA: Diagnosis not present

## 2021-03-04 LAB — CBC WITH DIFFERENTIAL/PLATELET
Abs Immature Granulocytes: 0.11 10*3/uL — ABNORMAL HIGH (ref 0.00–0.07)
Abs Immature Granulocytes: 0.15 10*3/uL — ABNORMAL HIGH (ref 0.00–0.07)
Basophils Absolute: 0 10*3/uL (ref 0.0–0.1)
Basophils Absolute: 0 10*3/uL (ref 0.0–0.1)
Basophils Relative: 0 %
Basophils Relative: 0 %
Eosinophils Absolute: 0.1 10*3/uL (ref 0.0–0.5)
Eosinophils Absolute: 0 10*3/uL (ref 0.0–0.5)
Eosinophils Relative: 1 %
Eosinophils Relative: 0 %
HCT: 22.8 % — ABNORMAL LOW (ref 36.0–46.0)
HCT: 21 % — ABNORMAL LOW (ref 36.0–46.0)
Hemoglobin: 7 g/dL — ABNORMAL LOW (ref 12.0–15.0)
Hemoglobin: 6.6 g/dL — ABNORMAL LOW (ref 12.0–15.0)
Immature Granulocytes: 1 %
Immature Granulocytes: 1 %
Lymphocytes Relative: 9 %
Lymphocytes Relative: 5 %
Lymphs Abs: 1.4 10*3/uL (ref 0.7–4.0)
Lymphs Abs: 0.7 10*3/uL (ref 0.7–4.0)
MCH: 31.4 pg (ref 26.0–34.0)
MCH: 31.9 pg (ref 26.0–34.0)
MCHC: 30.7 g/dL (ref 30.0–36.0)
MCHC: 31.4 g/dL (ref 30.0–36.0)
MCV: 102.2 fL — ABNORMAL HIGH (ref 80.0–100.0)
MCV: 101.4 fL — ABNORMAL HIGH (ref 80.0–100.0)
Monocytes Absolute: 0.6 10*3/uL (ref 0.1–1.0)
Monocytes Absolute: 0.4 10*3/uL (ref 0.1–1.0)
Monocytes Relative: 4 %
Monocytes Relative: 2 %
Neutro Abs: 12.5 10*3/uL — ABNORMAL HIGH (ref 1.7–7.7)
Neutro Abs: 13.8 10*3/uL — ABNORMAL HIGH (ref 1.7–7.7)
Neutrophils Relative %: 85 %
Neutrophils Relative %: 92 %
Platelets: 190 10*3/uL (ref 150–400)
Platelets: 161 10*3/uL (ref 150–400)
RBC: 2.23 MIL/uL — ABNORMAL LOW (ref 3.87–5.11)
RBC: 2.07 MIL/uL — ABNORMAL LOW (ref 3.87–5.11)
RDW: 18.9 % — ABNORMAL HIGH (ref 11.5–15.5)
RDW: 18.8 % — ABNORMAL HIGH (ref 11.5–15.5)
WBC: 14.6 10*3/uL — ABNORMAL HIGH (ref 4.0–10.5)
WBC: 15 10*3/uL — ABNORMAL HIGH (ref 4.0–10.5)
nRBC: 0 % (ref 0.0–0.2)
nRBC: 0 % (ref 0.0–0.2)

## 2021-03-04 LAB — GLUCOSE, CAPILLARY
Glucose-Capillary: 103 mg/dL — ABNORMAL HIGH (ref 70–99)
Glucose-Capillary: 97 mg/dL (ref 70–99)
Glucose-Capillary: 95 mg/dL (ref 70–99)
Glucose-Capillary: 95 mg/dL (ref 70–99)
Glucose-Capillary: 84 mg/dL (ref 70–99)
Glucose-Capillary: 96 mg/dL (ref 70–99)
Glucose-Capillary: 82 mg/dL (ref 70–99)

## 2021-03-04 LAB — MAGNESIUM: Magnesium: 2 mg/dL (ref 1.7–2.4)

## 2021-03-04 LAB — PROTIME-INR
INR: 1.2 (ref 0.8–1.2)
Prothrombin Time: 15.1 seconds (ref 11.4–15.2)

## 2021-03-04 LAB — PREPARE RBC (CROSSMATCH)

## 2021-03-04 LAB — BASIC METABOLIC PANEL
Anion gap: 7 (ref 5–15)
BUN: 52 mg/dL — ABNORMAL HIGH (ref 8–23)
CO2: 16 mmol/L — ABNORMAL LOW (ref 22–32)
Calcium: 8 mg/dL — ABNORMAL LOW (ref 8.9–10.3)
Chloride: 122 mmol/L — ABNORMAL HIGH (ref 98–111)
Creatinine, Ser: 3.08 mg/dL — ABNORMAL HIGH (ref 0.44–1.00)
GFR, Estimated: 15 mL/min — ABNORMAL LOW (ref >60)
Glucose, Bld: 100 mg/dL — ABNORMAL HIGH (ref 70–99)
Potassium: 4.8 mmol/L (ref 3.5–5.1)
Sodium: 145 mmol/L (ref 135–145)

## 2021-03-04 LAB — HEMOGLOBIN
Hemoglobin: 4.8 g/dL — CL (ref 12.0–15.0)
Hemoglobin: 9.1 g/dL — ABNORMAL LOW (ref 12.0–15.0)

## 2021-03-04 LAB — MRSA PCR SCREENING: MRSA by PCR: NEGATIVE

## 2021-03-04 LAB — APTT: aPTT: 40 seconds — ABNORMAL HIGH (ref 24–36)

## 2021-03-04 MED ORDER — PANTOPRAZOLE SODIUM 40 MG IV SOLR: 40.0000 mg | Freq: Two times a day (BID) | INTRAVENOUS | Status: DC

## 2021-03-04 MED ORDER — SODIUM CHLORIDE 0.9% IV SOLUTION: Freq: Once | INTRAVENOUS | Status: DC

## 2021-03-04 MED ORDER — SODIUM CHLORIDE 0.9 % IV SOLN
80.0000 mg | Freq: Once | INTRAVENOUS | Status: AC
  Administered 2021-03-04: 80 mg via INTRAVENOUS
  Filled 2021-03-04: qty 80

## 2021-03-04 MED ORDER — SODIUM CHLORIDE 0.9 % IV SOLN
8.0000 mg/h | INTRAVENOUS | Status: DC
  Administered 2021-03-04 – 2021-03-05 (×2): 8 mg/h via INTRAVENOUS
  Filled 2021-03-04 (×4): qty 80

## 2021-03-04 MED ORDER — ORAL CARE MOUTH RINSE
15.0000 mL | Freq: Two times a day (BID) | OROMUCOSAL | Status: DC
  Administered 2021-03-04 – 2021-03-11 (×14): 15 mL via OROMUCOSAL

## 2021-03-04 MED ORDER — SODIUM CHLORIDE 0.9 % IV SOLN: INTRAVENOUS | Status: DC

## 2021-03-04 MED ORDER — PANTOPRAZOLE SODIUM 40 MG IV SOLR
40.0000 mg | Freq: Two times a day (BID) | INTRAVENOUS | Status: DC
  Administered 2021-03-04: 40 mg via INTRAVENOUS
  Filled 2021-03-04: qty 40

## 2021-03-04 NOTE — Progress Notes (Signed)
   03/04/21 1540  Notify: Charge Nurse/RN  Name of Charge Nurse/RN Notified Deneen Harts RN  Date Charge Nurse/RN Notified 03/04/21  Time Charge Nurse/RN Notified 1540  Notify: Provider  Provider Name/Title Dr. Georgeann Oppenheim  Date Provider Notified 03/04/21  Time Provider Notified 1540  Notification Type Call  Notification Reason Critical result;Change in status  Provider response At bedside;See new orders;Other (Comment) (Transfer pt to stepdown)  Date of Provider Response 03/04/21  Time of Provider Response 1540  Notify: Rapid Response  Name of Rapid Response RN Notified Madelin Rear, RN  Date Rapid Response Notified 03/04/21  Time Rapid Response Notified 1540  Document  Patient Outcome Transferred/level of care increased  Progress note created (see row info) Yes   Pt vomited black/bloody emesis. Thick golf ball size blood clots also appeared within the emesis. Contacted attending MD and Rapid response Nurse. MD and Rapid nurse at bedside noted.  Pt with defecating another medium amount of blood clots via rectum. Pt running orthostatic blood pressure at this time. Pt expressed discomfort to the abdomen as this nurse palpated RUQ, RLQ, LUQ & LLQ. Pt stated "that could probably because I need to pee."  Pt transferred to ICU for further treatment.

## 2021-03-04 NOTE — Care Management (Addendum)
Rapid response called for acute onset of coffee-ground type emesis.  I responded to bedside.  Patient looks pale but is mentating clearly.  A little on hypotensive side.  First unit of blood transfusing.  Notified GI who indicates the patient needs to be adequately resuscitated before considering endoscopy.  Also mention patient's poor nutritional status and dry mucous membranes.  The patient has been on intravenous fluids will increase rate.  Unfortunately prolonged illness has negatively impacted this patient's p.o. tolerance.  Transfer to stepdown for closer monitoring.  Plan tentatively for endoscopy on 3/15.  Checking anemia parameters and coagulation studies.  Increased rate of maintenance fluids 150 cc/h.  Transfuse total 3 units packed red blood cells.  Lolita Patella MD  Addendum: Patients BP improving after PRBC transfusion initiated.  Mentating more clearly.  Had a discussion with patient about her current clinical status.  Code status changed to DO NOT RESUSCITATE.  Patient understood and had decision making capacity at the time of my interview  Lolita Patella MD

## 2021-03-04 NOTE — Progress Notes (Addendum)
   03/04/21 1000  Notify: Provider  Provider Name/Title Dr. Georgeann Oppenheim  Date Provider Notified 03/04/21  Time Provider Notified 1034  Notification Type Face-to-face  Notification Reason Critical result (hemeglobin level 7.0/ defecating clots of blood.)  Provider response No new orders  Date of Provider Response 03/04/21  Time of Provider Response 1034   MD notified regarding drastic drop of hemoglobin level.  Level on 03/03/21 (9.0), 03/04/21 (7.0). Patient defecated to a large amount of blood clots. MD made aware. GI made aware.

## 2021-03-04 NOTE — Progress Notes (Signed)
OT Cancellation Note  Patient Details Name: Melanie White MRN: [id number] DOB: 1946/11/10   Cancelled Treatment:    Reason Eval/Treat Not Completed: Medical issues which prohibited therapy. Chart reviewed and pt noted to have Hb drop 9.0 to 7.0, with RN reporting copious rectal bleeding this AM and requesting therapy hold today. Will continue to follow and provide services as pt medically appropriate to participate in therapy.  Matthew Folks, OTR/L [facility] [phone]

## 2021-03-04 NOTE — Progress Notes (Signed)
PT Cancellation Note  Patient Details Name: TANZA PELLOT MRN: [id number] DOB: 1946/09/10   Cancelled Treatment:    Reason Eval/Treat Not Completed: Medical issues which prohibited therapy   HgB 7.0 trending down.  See MD note for details.  Will hold therapy today and continue as appropriate.   Danielle Dess 03/04/2021, 1:44 PM
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
## 2021-03-04 NOTE — Consult Note (Signed)
Consultation  Referring Provider:     Dr Georgeann OppenheimSreenath Admit date: 02/09/21 Consult date        03/04/21 Reason for Consultation:     Anemia/bloody stool         HPI:   Melanie White is a 75 y.o. female admitted last month for hip fracture/also with Covid. GI was consulter 2w ago, seen by Dr Norma Fredricksonoledo, for melena/hematemesis on day 11 and underwent EGD, as below. A few days later patient had problems with diarrhea, found to have cdiff so has been treated with oral vancomycin.  GI has been reconsulted for additional GI bleeding and anemia exacerbation. Has been decreasing- 7 this am and now 4.8.  Nurse reports patient passed a black stool very early this am and then a red stool with dark clotty matter a few hours ago. Patient is a difficult historian- mentation has been waxing and waning so information gathered from nursing staff/chart review. Over the last several days patient's appetite has been poor, despite attempts to feed her and nutritional supplements. There has been no abdominal pain, vomiting, nausea. Diarrhea was improving.  There were no other GI related concerns. She is on bid IV PPI and receiving fluids.  PREVIOUS ENDOSCOPIES:            EGD- Dr Norma Fredricksonoledo- moderately severe esophagitis, erosvie gastritis with coffee ground material, and multiple duodenal ulcers. h pylori stool antigen negative.  Colonoscopy in 2006 was deemed as normal except for internal hemorrhoids.   Past Medical History:  Diagnosis Date  . Anxiety   . Depression   . Hyperlipidemia   . Hypertension     Past Surgical History:  Procedure Laterality Date  . ABDOMINAL HYSTERECTOMY     Partial Hysterectomy  . ESOPHAGOGASTRODUODENOSCOPY (EGD) WITH PROPOFOL N/A 02/21/2021   Procedure: ESOPHAGOGASTRODUODENOSCOPY (EGD) WITH PROPOFOL;  Surgeon: Toledo, Boykin Nearingeodoro K, MD;  Location: [facility] ENDOSCOPY;  Service: Gastroenterology;  Laterality: N/A;  . HIP ARTHROPLASTY Right 02/10/2021   Procedure: ARTHROPLASTY BIPOLAR HIP  (HEMIARTHROPLASTY);  Surgeon: Juanell FairlyKrasinski, Kevin, MD;  Location: [facility] ORS;  Service: Orthopedics;  Laterality: Right;  . TONSILLECTOMY      Family History  Problem Relation Age of Onset  . Anxiety disorder Mother   . Hypertension Mother   . Heart attack Father   . Epilepsy Sister   . Hypertension Sister   . Anxiety disorder Maternal Aunt     Social History   Tobacco Use  . Smoking status: Current Every Day Smoker    Types: Cigarettes  . Smokeless tobacco: Never Used  Vaping Use  . Vaping Use: Never used  Substance Use Topics  . Alcohol use: No  . Drug use: No    Prior to Admission medications   Medication Sig Start Date End Date Taking? Authorizing Provider  acetaminophen (TYLENOL) 650 MG suppository Place 650 mg rectally every 4 (four) hours as needed for moderate pain or mild pain.   Yes [provider]  amLODipine (NORVASC) 10 MG tablet Take 1 tablet (10 mg total) by mouth daily. 11/06/20  Yes Osvaldo AngstPollak, Adriana M, PA-C  metoprolol tartrate (LOPRESSOR) 50 MG tablet Take 1 tablet (50 mg total) by mouth 2 (two) times daily. 11/06/20 05/05/21 Yes Pollak, Lavella HammockAdriana M, PA-C  pantoprazole (PROTONIX) 40 MG tablet Take one daily as needed for hearburn not relieved by pantoprazole. 11/06/20  Yes Osvaldo AngstPollak, Adriana M, PA-C  sertraline (ZOLOFT) 50 MG tablet Take 1.5 tablets (75 mg total) by mouth daily. 11/06/20 05/05/21 Yes Trey SailorsPollak, Adriana M, PA-C  simvastatin (ZOCOR) 40 MG tablet Take 1 tablet (40 mg total) by mouth at bedtime. 11/06/20  Yes Trey Sailors, PA-C  TURMERIC PO Take by mouth.   Yes [provider]    Current Facility-Administered Medications  Medication Dose Route Frequency Provider Last Rate Last Admin  . (feeding supplement) PROSource Plus liquid 30 mL  30 mL Oral QID Darlin Priestly, MD   30 mL at 03/04/21 0949  . 0.9 %  sodium chloride infusion (Manually program via Guardrails IV Fluids)   Intravenous Once Charise Killian, MD      . 0.9 %  sodium chloride  infusion (Manually program via Guardrails IV Fluids)   Intravenous Once Sreenath, Sudheer B, MD      . 0.9 %  sodium chloride infusion (Manually program via Guardrails IV Fluids)   Intravenous Once Sreenath, Sudheer B, MD      . 0.9 %  sodium chloride infusion   Intravenous PRN Darlin Priestly, MD 10 mL/hr at 02/24/21 1213 250 mL at 02/24/21 1213  . 0.9 %  sodium chloride infusion   Intravenous Continuous Lolita Patella B, MD 100 mL/hr at 03/04/21 1010 New Bag at 03/04/21 1010  . acetaminophen (TYLENOL) tablet 325-650 mg  325-650 mg Oral Q6H PRN Juanell Fairly, MD   650 mg at 03/03/21 2133  . acidophilus (RISAQUAD) capsule 1 capsule  1 capsule Oral Daily Charise Killian, MD   1 capsule at 03/04/21 0950  . alum & mag hydroxide-simeth (MAALOX/MYLANTA) 200-200-20 MG/5ML suspension 30 mL  30 mL Oral Q4H PRN Juanell Fairly, MD   30 mL at 02/15/21 2021  . amLODipine (NORVASC) tablet 10 mg  10 mg Oral Daily Juanell Fairly, MD   10 mg at 03/04/21 0951  . atorvastatin (LIPITOR) tablet 20 mg  20 mg Oral QHS Sharen Hones, RPH   20 mg at 03/03/21 2132  . bisacodyl (DULCOLAX) suppository 10 mg  10 mg Rectal Daily PRN Juanell Fairly, MD      . calcium carbonate (TUMS - dosed in mg elemental calcium) chewable tablet 200 mg of elemental calcium  1 tablet Oral TID PRN Darlin Priestly, MD   200 mg of elemental calcium at 02/15/21 1250  . Chlorhexidine Gluconate Cloth 2 % PADS 6 each  6 each Topical Daily Darlin Priestly, MD   6 each at 03/04/21 [phone]  . cholestyramine (QUESTRAN) packet 4 g  4 g Oral BID Lolita Patella B, MD   4 g at 03/04/21 1054  . dronabinol (MARINOL) capsule 2.5 mg  2.5 mg Oral BID AC Charise Killian, MD   2.5 mg at 03/04/21 1214  . influenza vaccine adjuvanted (FLUAD) injection 0.5 mL  0.5 mL Intramuscular Tomorrow-1000 Lindajo Royal V, MD      . labetalol (NORMODYNE) injection 10 mg  10 mg Intravenous Q2H PRN Juanell Fairly, MD   10 mg at 02/09/21 2239  . lip balm (BLISTEX) ointment    Topical PRN Charise Killian, MD   Given at 02/25/21 [phone]  . liver oil-zinc oxide (DESITIN) 40 % ointment   Topical TID PRN Darlin Priestly, MD      . menthol-cetylpyridinium (CEPACOL) lozenge 3 mg  1 lozenge Oral PRN Manuela Schwartz, NP   3 mg at 02/15/21 2142  . methocarbamol (ROBAXIN) tablet 500 mg  500 mg Oral Q6H PRN Juanell Fairly, MD   500 mg at 02/20/21 2221   Or  . methocarbamol (ROBAXIN) 500 mg in dextrose 5 % 50 mL IVPB  500 mg Intravenous Q6H PRN Juanell Fairly, MD      . metoprolol tartrate (LOPRESSOR) tablet 50 mg  50 mg Oral BID Juanell Fairly, MD   50 mg at 03/04/21 0950  . multivitamin with minerals tablet 1 tablet  1 tablet Oral Daily Darlin Priestly, MD   1 tablet at 03/04/21 0950  . ondansetron (ZOFRAN) tablet 4 mg  4 mg Oral Q6H PRN Juanell Fairly, MD   4 mg at 02/21/21 2206   Or  . ondansetron (ZOFRAN) injection 4 mg  4 mg Intravenous Q6H PRN Juanell Fairly, MD   4 mg at 02/19/21 0003  . pantoprazole (PROTONIX) injection 40 mg  40 mg Intravenous Q12H Lolita Patella B, MD   40 mg at 03/04/21 0950  . scopolamine (TRANSDERM-SCOP) 1 MG/3DAYS 1.5 mg  1 patch Transdermal Q72H Charise Killian, MD   1.5 mg at 03/02/21 1241  . sertraline (ZOLOFT) tablet 75 mg  75 mg Oral Daily Juanell Fairly, MD   75 mg at 03/04/21 0950  . vancomycin (VANCOCIN) 50 mg/mL oral solution 125 mg  125 mg Oral QID Lolita Patella B, MD   125 mg at 03/04/21 0950    Allergies as of 02/09/2021 - Review Complete 02/09/2021  Allergen Reaction Noted  . Hydrochlorothiazide Diarrhea 09/28/2015     Review of Systems:    All systems reviewed and negative except where noted in HPI, with the exception of fatigue, malaise      Physical Exam:  Vital signs in last 24 hours: Temp:  [97.6 F (36.4 C)-98.9 F (37.2 C)] 98.9 F (37.2 C) (03/14 1127) Pulse Rate:  [70-93] 84 (03/14 1127) Resp:  [15-17] 15 (03/14 1127) BP: (90-140)/(51-66) 115/62 (03/14 1127) SpO2:  [93 %-100 %] 100 % (03/14  1127) Last BM Date: 03/03/21 General:   Pleasant woman in NAD Head:  Normocephalic and atraumatic. Eyes:   No icterus.   Conjunctiva pink. Ears:  Normal auditory acuity. Mouth: Mucosa pink moist, no lesions. Neck:  Supple; no masses felt Lungs:Respirations even and unlabored. Lungs clear to auscultation bilaterally.   No wheezes, crackles, or rhonchi.  Heart:  S1S2, RRR, no MRG. No edema. Abdomen:   Flat, soft, nondistended, nontender. Normal bowel sounds. No appreciable masses or hepatomegaly. No rebound signs or other peritoneal signs. Rectal:  Not performed.  Msk:  MAEW x4, No clubbing or cyanosis. Strength 5/5. Symmetrical without gross deformities. Neurologic:  Alert and  oriented x2;  Cranial nerves II-XII intact.  Skin:  Warm, dry, pink without significant lesions or rashes. Psych:  Alert and cooperative but slow to respond at times. Normal affect.  LAB RESULTS: Recent Labs    03/02/21 0426 03/03/21 0436 03/04/21 0645 03/04/21 1326  WBC 15.5* 14.1* 14.6*  --   HGB 8.6* 9.0* 7.0* 4.8*  HCT 26.9* 27.7* 22.8*  --   PLT 284 244 190  --    BMET Recent Labs    03/02/21 0426 03/03/21 0436 03/04/21 0645  NA 144 144 145  K 2.9* 4.8 4.8  CL 118* 121* 122*  CO2 18* 18* 16*  GLUCOSE 99 71 100*  BUN 31* 31* 52*  CREATININE 2.52* 2.60* 3.08*  CALCIUM 7.6* 7.9* 8.0*   LFT No results for input(s): PROT, ALBUMIN, AST, ALT, ALKPHOS, BILITOT, BILIDIR, IBILI in the last 72 hours. PT/INR No results for input(s): LABPROT, INR in the last 72 hours.  STUDIES: No results found.     Impression / Plan:   1. Duodenal ulcers, concerns for  further melena and rectal bleeding. Low hemoglobin.  Fo rnow, transfuse 1u prbc, recheck cbc after. May need additional unit.  Also assess pt/inr, b12. Current plan is for EGD tomorrow as clincally feasible. 2. Poor nutrition- may need additional interventions  Thank you very much for this consult. These services were provided by Vevelyn Pat, NP-C, in collaboration with Regis Bill, MD, with whom I have discussed this patient in full.   Vevelyn Pat, NP-C

## 2021-03-04 NOTE — Progress Notes (Signed)
Rapid Response Event Note   Reason for Call :   - Emesis  Initial Focused Assessment:   - Patient with a known GIB; EGD tomorrow - Continues with bloody BMs and emesis - Is receiving 1st of 3 units of PRBC now - No acute distress  Interventions:   - Dr. Georgeann Oppenheim paged to bedside - Orders placed for coag panel  Plan of Care:   - Transfer to SDU - EGD tomorrow  Event Summary:   MD Notified:  Dr Georgeann Oppenheim Call Time: 1523 hrs Arrival Time: 1536 hrs End Time: 1550 hrs  Rosana Fret, RN
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
## 2021-03-05 ENCOUNTER — Inpatient Hospital Stay: Payer: Medicare Other | Admitting: Anesthesiology

## 2021-03-05 ENCOUNTER — Inpatient Hospital Stay: Payer: Medicare Other

## 2021-03-05 ENCOUNTER — Encounter
Admission: EM | Disposition: A | Discharge status: Skilled Nursing Facility | Payer: Self-pay | Source: Home / Self Care | Attending: Hospitalist

## 2021-03-05 ENCOUNTER — Encounter: Payer: Self-pay | Admitting: Internal Medicine

## 2021-03-05 DIAGNOSIS — S72011A Unspecified intracapsular fracture of right femur, initial encounter for closed fracture: Secondary | ICD-10-CM | POA: Diagnosis not present

## 2021-03-05 DIAGNOSIS — N179 Acute kidney failure, unspecified: Secondary | ICD-10-CM | POA: Diagnosis not present

## 2021-03-05 DIAGNOSIS — E43 Unspecified severe protein-calorie malnutrition: Secondary | ICD-10-CM | POA: Diagnosis not present

## 2021-03-05 HISTORY — PX: ESOPHAGOGASTRODUODENOSCOPY: SHX5428

## 2021-03-05 LAB — URINALYSIS, COMPLETE (UACMP) WITH MICROSCOPIC
Bilirubin Urine: NEGATIVE
Glucose, UA: NEGATIVE mg/dL
Ketones, ur: NEGATIVE mg/dL
Nitrite: NEGATIVE
Protein, ur: 30 mg/dL — AB
RBC / HPF: >50 RBC/hpf — ABNORMAL HIGH (ref 0–5)
Specific Gravity, Urine: 1.011 (ref 1.005–1.030)
Squamous Epithelial / HPF: NONE SEEN (ref 0–5)
pH: 6 (ref 5.0–8.0)

## 2021-03-05 LAB — CBC WITH DIFFERENTIAL/PLATELET
Abs Immature Granulocytes: 0.07 10*3/uL (ref 0.00–0.07)
Basophils Absolute: 0 10*3/uL (ref 0.0–0.1)
Basophils Relative: 0 %
Eosinophils Absolute: 0 10*3/uL (ref 0.0–0.5)
Eosinophils Relative: 0 %
HCT: 27.5 % — ABNORMAL LOW (ref 36.0–46.0)
Hemoglobin: 9 g/dL — ABNORMAL LOW (ref 12.0–15.0)
Immature Granulocytes: 1 %
Lymphocytes Relative: 6 %
Lymphs Abs: 0.8 10*3/uL (ref 0.7–4.0)
MCH: 31.8 pg (ref 26.0–34.0)
MCHC: 32.7 g/dL (ref 30.0–36.0)
MCV: 97.2 fL (ref 80.0–100.0)
Monocytes Absolute: 0.5 10*3/uL (ref 0.1–1.0)
Monocytes Relative: 4 %
Neutro Abs: 11.4 10*3/uL — ABNORMAL HIGH (ref 1.7–7.7)
Neutrophils Relative %: 89 %
Platelets: 140 10*3/uL — ABNORMAL LOW (ref 150–400)
RBC: 2.83 MIL/uL — ABNORMAL LOW (ref 3.87–5.11)
RDW: 15.6 % — ABNORMAL HIGH (ref 11.5–15.5)
WBC: 12.7 10*3/uL — ABNORMAL HIGH (ref 4.0–10.5)
nRBC: 0 % (ref 0.0–0.2)

## 2021-03-05 LAB — BASIC METABOLIC PANEL
Anion gap: 9 (ref 5–15)
BUN: 65 mg/dL — ABNORMAL HIGH (ref 8–23)
CO2: 12 mmol/L — ABNORMAL LOW (ref 22–32)
Calcium: 7.4 mg/dL — ABNORMAL LOW (ref 8.9–10.3)
Chloride: 128 mmol/L — ABNORMAL HIGH (ref 98–111)
Creatinine, Ser: 3.87 mg/dL — ABNORMAL HIGH (ref 0.44–1.00)
GFR, Estimated: 12 mL/min — ABNORMAL LOW (ref >60)
Glucose, Bld: 82 mg/dL (ref 70–99)
Potassium: 4.3 mmol/L (ref 3.5–5.1)
Sodium: 149 mmol/L — ABNORMAL HIGH (ref 135–145)

## 2021-03-05 LAB — HEMOGLOBIN AND HEMATOCRIT, BLOOD
HCT: 29.9 % — ABNORMAL LOW (ref 36.0–46.0)
Hemoglobin: 9.7 g/dL — ABNORMAL LOW (ref 12.0–15.0)

## 2021-03-05 LAB — HEMOGLOBIN
Hemoglobin: 9.8 g/dL — ABNORMAL LOW (ref 12.0–15.0)
Hemoglobin: 8.5 g/dL — ABNORMAL LOW (ref 12.0–15.0)

## 2021-03-05 LAB — PROTIME-INR
INR: 1.3 — ABNORMAL HIGH (ref 0.8–1.2)
Prothrombin Time: 16.1 seconds — ABNORMAL HIGH (ref 11.4–15.2)

## 2021-03-05 LAB — VITAMIN B12: Vitamin B-12: 286 pg/mL (ref 180–914)

## 2021-03-05 LAB — MAGNESIUM: Magnesium: 1.9 mg/dL (ref 1.7–2.4)

## 2021-03-05 LAB — GLUCOSE, CAPILLARY
Glucose-Capillary: 104 mg/dL — ABNORMAL HIGH (ref 70–99)
Glucose-Capillary: 113 mg/dL — ABNORMAL HIGH (ref 70–99)
Glucose-Capillary: 71 mg/dL (ref 70–99)
Glucose-Capillary: 98 mg/dL (ref 70–99)

## 2021-03-05 SURGERY — EGD (ESOPHAGOGASTRODUODENOSCOPY)
Anesthesia: General

## 2021-03-05 MED ORDER — LIDOCAINE HCL (CARDIAC) PF 100 MG/5ML IV SOSY
PREFILLED_SYRINGE | INTRAVENOUS | Status: DC | PRN
  Administered 2021-03-05: 40 mg via INTRAVENOUS

## 2021-03-05 MED ORDER — PHENYLEPHRINE HCL (PRESSORS) 10 MG/ML IV SOLN
INTRAVENOUS | Status: DC | PRN
  Administered 2021-03-05 (×3): 100 ug via INTRAVENOUS

## 2021-03-05 MED ORDER — PANTOPRAZOLE SODIUM 40 MG IV SOLR
40.0000 mg | Freq: Two times a day (BID) | INTRAVENOUS | Status: DC
  Administered 2021-03-05 – 2021-03-11 (×13): 40 mg via INTRAVENOUS
  Filled 2021-03-05 (×13): qty 40

## 2021-03-05 MED ORDER — PROPOFOL 10 MG/ML IV BOLUS
INTRAVENOUS | Status: DC | PRN
  Administered 2021-03-05 (×4): 20 mg via INTRAVENOUS

## 2021-03-05 MED ORDER — SODIUM BICARBONATE 8.4 % IV SOLN
INTRAVENOUS | Status: DC
  Filled 2021-03-05 (×2): qty 150

## 2021-03-05 MED ORDER — SODIUM BICARBONATE 8.4 % IV SOLN
INTRAVENOUS | Status: DC
  Filled 2021-03-05 (×2): qty 1000

## 2021-03-05 MED ORDER — SODIUM BICARBONATE-DEXTROSE 150-5 MEQ/L-% IV SOLN
150.0000 meq | INTRAVENOUS | Status: DC
  Filled 2021-03-05: qty 1000

## 2021-03-05 MED ORDER — SODIUM BICARBONATE 8.4 % IV SOLN
INTRAVENOUS | Status: DC
  Filled 2021-03-05 (×2): qty 850
  Filled 2021-03-05: qty 150
  Filled 2021-03-05: qty 850
  Filled 2021-03-05: qty 150

## 2021-03-05 MED ORDER — DEXTROSE-NACL 5-0.45 % IV SOLN: INTRAVENOUS | Status: DC

## 2021-03-05 NOTE — Plan of Care (Signed)
PMT note:   Consult noted. Patient had EGD today. Unable to participate in GOC. No family listed. Will need decision making ability or a surrogate decision maker.

## 2021-03-05 NOTE — Anesthesia Postprocedure Evaluation (Signed)
Anesthesia Post Note  Patient: Melanie White  Procedure(s) Performed: ESOPHAGOGASTRODUODENOSCOPY (EGD) (N/A )  Patient location during evaluation: Endoscopy Anesthesia Type: General Level of consciousness: confused (baseline confusion) Pain management: pain level controlled Vital Signs Assessment: post-procedure vital signs reviewed and stable Respiratory status: spontaneous breathing, nonlabored ventilation, respiratory function stable and patient connected to nasal cannula oxygen Cardiovascular status: blood pressure returned to baseline and stable Postop Assessment: no apparent nausea or vomiting Anesthetic complications: no   No complications documented.   Last Vitals:  Vitals:   03/05/21 1157 03/05/21 1207  BP: (!) 111/50   Pulse:    Resp:    Temp:    SpO2: 100% 100%    Last Pain:  Vitals:   03/05/21 1217  TempSrc:   PainSc: 0-No pain                 Cleda Mccreedy Kolbe Delmonaco

## 2021-03-05 NOTE — Progress Notes (Signed)
NP notified Hgb 9.8, after 2 units pRBC's given. Verbal order to hold 3rd unit of pRBC's. Continue to monitor Hgb q 6 hours.

## 2021-03-05 NOTE — Op Note (Signed)
[facility] Gastroenterology Patient Name: Melanie White Procedure Date: 03/05/2021 11:15 AM MRN: [id number] Account #: [id number] Date of Birth: [birth year] Admit Type: Outpatient Age: 75 Room: [facility] ENDO ROOM 1 Gender: Female Note Status: Finalized Procedure:             Upper GI endoscopy Indications:           Coffee-ground emesis Providers:             Andrey Farmer MD, MD Medicines:             Monitored Anesthesia Care Complications:         No immediate complications. Procedure:             Pre-Anesthesia Assessment:                        - Prior to the procedure, a History and Physical was                         performed, and patient medications and allergies were                         reviewed. The patient is competent. The risks and                         benefits of the procedure and the sedation options and                         risks were discussed with the patient. All questions                         were answered and informed consent was obtained.                         Patient identification and proposed procedure were                         verified by the physician, the nurse, the anesthetist                         and the technician in the endoscopy suite. Mental                         Status Examination: alert and oriented. Airway                         Examination: normal oropharyngeal airway and neck                         mobility. Respiratory Examination: clear to                         auscultation. CV Examination: normal. Prophylactic                         Antibiotics: The patient does not require prophylactic                         antibiotics. Prior Anticoagulants: The patient has  taken no previous anticoagulant or antiplatelet                         agents. ASA Grade Assessment: IV - A patient with                         severe systemic disease that is a constant threat to                          life. After reviewing the risks and benefits, the                         patient was deemed in satisfactory condition to                         undergo the procedure. The anesthesia plan was to use                         monitored anesthesia care (MAC). Immediately prior to                         administration of medications, the patient was                         re-assessed for adequacy to receive sedatives. The                         heart rate, respiratory rate, oxygen saturations,                         blood pressure, adequacy of pulmonary ventilation, and                         response to care were monitored throughout the                         procedure. The physical status of the patient was                         re-assessed after the procedure.                        After obtaining informed consent, the endoscope was                         passed under direct vision. Throughout the procedure,                         the patient's blood pressure, pulse, and oxygen                         saturations were monitored continuously. The Endoscope                         was introduced through the mouth, and advanced to the                         second part of duodenum. The upper GI endoscopy  was                         technically difficult and complex due to poor                         endoscopic visualization. The patient tolerated the                         procedure well. Findings:      LA Grade D (one or more mucosal breaks involving at least 75% of       esophageal circumference) esophagitis with no bleeding was found.      Hematin (altered blood/coffee-ground-like material) was found in the       gastric fundus. Multiple attempts to clear this area was performed       without success. On EGD performed two weeks ago, this area was mentioned       as normal.      One non-bleeding cratered duodenal ulcer with adherent clot was found in       the first  portion of the duodenum. The lesion was 4 mm in largest       dimension. Given no other site for bleeding one hemostatic clip was       successfully placed. There was no bleeding during, or at the end, of the       procedure.      The exam of the duodenum was otherwise normal. Impression:            - LA Grade D esophagitis with no bleeding.                        - Hematin (altered blood/coffee-ground-like material)                         in the gastric fundus.                        - Non-bleeding duodenal ulcer with adherent clot. Clip                         was placed.                        - No specimens collected. Recommendation:        - Return patient to hospital ward for ongoing care.                        - Resume previous diet.                        - Continue present medications. Continue IV PPI BID                        - Could consider repeat EGD but given comorbidities,                         this would be high risk. Would recommend palliative                         care consult. Procedure Code(s):     --- Professional ---  43255, Esophagogastroduodenoscopy, flexible,                         transoral; with control of bleeding, any method Diagnosis Code(s):     --- Professional ---                        K20.90, Esophagitis, unspecified without bleeding                        K92.2, Gastrointestinal hemorrhage, unspecified                        K26.4, Chronic or unspecified duodenal ulcer with                         hemorrhage                        K92.0, Hematemesis CPT copyright 2019 [employer]. All rights reserved. The codes documented in this report are preliminary and upon coder review may  be revised to meet current compliance requirements. Andrey Farmer MD, MD 03/05/2021 11:55:57 AM Number of Addenda: 0 Note Initiated On: 03/05/2021 11:15 AM Estimated Blood Loss:  Estimated blood loss: none.      [facility]

## 2021-03-05 NOTE — Transfer of Care (Signed)
Immediate Anesthesia Transfer of Care Note  Patient: Melanie White  Procedure(s) Performed: ESOPHAGOGASTRODUODENOSCOPY (EGD) (N/A )  Patient Location: PACU and Endoscopy Unit  Anesthesia Type:General  Level of Consciousness: confused  Airway & Oxygen Therapy: Patient Spontanous Breathing  Post-op Assessment: Report given to RN  Post vital signs: stable  Last Vitals:  Vitals Value Taken Time  BP    Temp    Pulse    Resp    SpO2      Last Pain:  Vitals:   03/05/21 1047  TempSrc: Temporal  PainSc:       Patients Stated Pain Goal: 0 (02/28/21 1000)  Complications: No complications documented.

## 2021-03-05 NOTE — Progress Notes (Signed)
OT Cancellation Note  Patient Details Name: Melanie White MRN: [id number] DOB: 1946/05/03   Cancelled Treatment:    Reason Eval/Treat Not Completed: Medical issues which prohibited therapy;Patient not medically ready. Chart reviewed. Pt noted with rapid response called on 3/14 with transfer to ICU for higher level of care due to change in medical status. Will complete current OT order. Please re-consult OT when the pt is medically appropriate.  Wynona Canes, MPH, MS, OTR/L ascom [phone] 03/05/21, 7:57 AM

## 2021-03-05 NOTE — Care Plan (Signed)
No family available for consent. Will perform two physician emergent consent due to upper GI bleed.  Merlyn Lot MD, MPH [facility] GI

## 2021-03-05 NOTE — Progress Notes (Signed)
Spoke with Dr. Georgeann Oppenheim, patient unable to void, small amounts of urine when patient has bowel movement noted. Bladder scanned patients over 457. Per MD place foley catheter.
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
## 2021-03-05 NOTE — Progress Notes (Signed)
Physical Therapy Discharge Patient Details Name: Melanie White MRN: [id number] DOB: 1946-12-10 Today's Date: 03/05/2021 Time:  -     Patient discharged from PT services secondary to transfer to ICU.  Pt transferred to ICU.  Will DC per protocol and await new orders as appropriate.  Danielle Dess 03/05/2021, 8:11 AM

## 2021-03-05 NOTE — Progress Notes (Signed)
Per Dr. Georgeann Oppenheim patient may go down to EGD WITHOUT nursing.

## 2021-03-05 NOTE — Progress Notes (Signed)
Clarification of transfusion orders. Per chart and shift report, patient was to receive 3 units pRBC's this shift. Blood bank only had 2 units allocated and charted as given.. NP notified of last noted Hgb result of 9.1 and needing clarification of whether to transfuse 3rd unit or await next Hgb result. Order received via secure chat to wait on upcoming Hgb result. [place] to monitor.

## 2021-03-05 NOTE — Progress Notes (Deleted)
Per Dr. Shah patient can go down for swallowing barium at 11:00 WITHOUT nursing, sitter will escort patient.  
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
## 2021-03-05 NOTE — Anesthesia Preprocedure Evaluation (Addendum)
Anesthesia Evaluation  Patient identified by MRN, date of birth, ID band Patient confused    Reviewed: Allergy & Precautions, H&P , NPO status , reviewed documented beta blocker date and time   Airway Mallampati: III  TM Distance: >3 FB Neck ROM: limited    Dental  (+) Poor Dentition, Edentulous Upper, Missing   Pulmonary Current Smoker and Patient abstained from smoking.,    Pulmonary exam normal        Cardiovascular hypertension, Normal cardiovascular exam     Neuro/Psych PSYCHIATRIC DISORDERS Anxiety Depression Consent signed by medical, no family avail   GI/Hepatic GERD  Controlled,  Endo/Other    Renal/GU ARFRenal disease     Musculoskeletal   Abdominal   Peds  Hematology  (+) Blood dyscrasia, anemia ,   Anesthesia Other Findings Patient is NPO appropriate with reports no nausea or vomiting today.  Past Medical History: No date: Anxiety No date: Depression No date: Hyperlipidemia No date: Hypertension  Past Surgical History: No date: ABDOMINAL HYSTERECTOMY     Comment:  Partial Hysterectomy 02/10/2021: HIP ARTHROPLASTY; Right     Comment:  Procedure: ARTHROPLASTY BIPOLAR HIP (HEMIARTHROPLASTY);               Surgeon: Juanell Fairly, MD;  Location: [facility] ORS;                Service: Orthopedics;  Laterality: Right; No date: TONSILLECTOMY  BMI    Body Mass Index: 18.14 kg/m      Reproductive/Obstetrics                            Anesthesia Physical  Anesthesia Plan  ASA: IV and emergent  Anesthesia Plan: General   Post-op Pain Management:    Induction: Intravenous  PONV Risk Score and Plan: Treatment may vary due to age or medical condition, TIVA and Ondansetron  Airway Management Planned: Nasal Cannula and Natural Airway  Additional Equipment:   Intra-op Plan:   Post-operative Plan:   Informed Consent: I have reviewed the patients History and Physical,  chart, labs and discussed the procedure including the risks, benefits and alternatives for the proposed anesthesia with the patient or authorized representative who has indicated his/her understanding and acceptance.   Patient has DNR.   Dental Advisory Given  Plan Discussed with: CRNA  Anesthesia Plan Comments: (Patient confused.  Per nursing and chart review no one is documented to consent for the patient.  This has been a problem with this patient in the past.  Dr. Mia Creek would like to proceed emergently in this situation.  I plan to try to avoid intubating this patient 2/2 her DNR status but will keep the option open to intubate for airway protection if needed.)       Anesthesia Quick Evaluation

## 2021-03-06 ENCOUNTER — Encounter: Payer: Self-pay | Admitting: Gastroenterology

## 2021-03-06 DIAGNOSIS — S72011A Unspecified intracapsular fracture of right femur, initial encounter for closed fracture: Secondary | ICD-10-CM | POA: Diagnosis not present

## 2021-03-06 DIAGNOSIS — N179 Acute kidney failure, unspecified: Secondary | ICD-10-CM | POA: Diagnosis not present

## 2021-03-06 DIAGNOSIS — Z515 Encounter for palliative care: Secondary | ICD-10-CM | POA: Diagnosis not present

## 2021-03-06 DIAGNOSIS — Z7189 Other specified counseling: Secondary | ICD-10-CM | POA: Diagnosis not present

## 2021-03-06 LAB — CBC WITH DIFFERENTIAL/PLATELET
Abs Immature Granulocytes: 0.05 10*3/uL (ref 0.00–0.07)
Basophils Absolute: 0 10*3/uL (ref 0.0–0.1)
Basophils Relative: 0 %
Eosinophils Absolute: 0 10*3/uL (ref 0.0–0.5)
Eosinophils Relative: 0 %
HCT: 20.4 % — ABNORMAL LOW (ref 36.0–46.0)
Hemoglobin: 6.6 g/dL — ABNORMAL LOW (ref 12.0–15.0)
Immature Granulocytes: 0 %
Lymphocytes Relative: 8 %
Lymphs Abs: 1 10*3/uL (ref 0.7–4.0)
MCH: 31.7 pg (ref 26.0–34.0)
MCHC: 32.4 g/dL (ref 30.0–36.0)
MCV: 98.1 fL (ref 80.0–100.0)
Monocytes Absolute: 0.4 10*3/uL (ref 0.1–1.0)
Monocytes Relative: 4 %
Neutro Abs: 10.2 10*3/uL — ABNORMAL HIGH (ref 1.7–7.7)
Neutrophils Relative %: 88 %
Platelets: 131 10*3/uL — ABNORMAL LOW (ref 150–400)
RBC: 2.08 MIL/uL — ABNORMAL LOW (ref 3.87–5.11)
RDW: 16.1 % — ABNORMAL HIGH (ref 11.5–15.5)
WBC: 11.7 10*3/uL — ABNORMAL HIGH (ref 4.0–10.5)
nRBC: 0 % (ref 0.0–0.2)

## 2021-03-06 LAB — BASIC METABOLIC PANEL
Anion gap: 7 (ref 5–15)
BUN: 63 mg/dL — ABNORMAL HIGH (ref 8–23)
CO2: 23 mmol/L (ref 22–32)
Calcium: 7.3 mg/dL — ABNORMAL LOW (ref 8.9–10.3)
Chloride: 121 mmol/L — ABNORMAL HIGH (ref 98–111)
Creatinine, Ser: 3.53 mg/dL — ABNORMAL HIGH (ref 0.44–1.00)
GFR, Estimated: 13 mL/min — ABNORMAL LOW (ref >60)
Glucose, Bld: 120 mg/dL — ABNORMAL HIGH (ref 70–99)
Potassium: 3.3 mmol/L — ABNORMAL LOW (ref 3.5–5.1)
Sodium: 151 mmol/L — ABNORMAL HIGH (ref 135–145)

## 2021-03-06 LAB — KAPPA/LAMBDA LIGHT CHAINS
Kappa free light chain: 88.4 mg/L — ABNORMAL HIGH (ref 3.3–19.4)
Kappa, lambda light chain ratio: 1.02 (ref 0.26–1.65)
Lambda free light chains: 86.4 mg/L — ABNORMAL HIGH (ref 5.7–26.3)

## 2021-03-06 LAB — GLUCOSE, CAPILLARY
Glucose-Capillary: 123 mg/dL — ABNORMAL HIGH (ref 70–99)
Glucose-Capillary: 141 mg/dL — ABNORMAL HIGH (ref 70–99)
Glucose-Capillary: 154 mg/dL — ABNORMAL HIGH (ref 70–99)
Glucose-Capillary: 96 mg/dL (ref 70–99)

## 2021-03-06 LAB — MAGNESIUM: Magnesium: 1.9 mg/dL (ref 1.7–2.4)

## 2021-03-06 LAB — C4 COMPLEMENT: Complement C4, Body Fluid: 27 mg/dL (ref 12–38)

## 2021-03-06 LAB — C3 COMPLEMENT: C3 Complement: 61 mg/dL — ABNORMAL LOW (ref 82–167)

## 2021-03-06 LAB — ANA W/REFLEX IF POSITIVE: Anti Nuclear Antibody (ANA): NEGATIVE

## 2021-03-06 LAB — PREPARE RBC (CROSSMATCH)

## 2021-03-06 MED ORDER — SODIUM CHLORIDE 0.9% IV SOLUTION: Freq: Once | INTRAVENOUS | Status: AC

## 2021-03-06 MED ORDER — KCL IN DEXTROSE-NACL 20-5-0.45 MEQ/L-%-% IV SOLN
INTRAVENOUS | Status: DC
  Filled 2021-03-06 (×7): qty 1000
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
## 2021-03-06 NOTE — Consult Note (Signed)
Consultation Note Date: 03/06/2021   Patient Name: Melanie White  DOB: September 16, 1946  MRN: [id number]  Age / Sex: 75 y.o., female  PCP: Trey Sailors, PA-C Referring Physician: Darlin Priestly, MD  Reason for Consultation: Establishing goals of care  HPI/Patient Profile: Right leg pain pain with difficulty ambulating, vomiting and diarrhea with Covid exposure  Clinical Assessment and Goals of Care: Patient is resting in bed. She is alert and oriented today. She states she has no further living relatives and nobody to make decisions for her. She states she was functionally independent prior to this hospitalization.  She tells me she would never want a feeding tube, intubation, or CPR. She states if she comes to a place where she is not functionally independent and able to live alone, she would want to focus on comfort and dignity for what time she has left to live. She would not want any further life prolonging care. Spiritual care consulted for living will.       SUMMARY OF RECOMMENDATIONS   DNR/DNI. Treat the treatable.    Prognosis:   Unable to determine      Primary Diagnoses: Present on Admission: . HLD (hyperlipidemia)   I have reviewed the medical record, interviewed the patient and family, and examined the patient. The following aspects are pertinent.  Past Medical History:  Diagnosis Date  . Anxiety   . Depression   . Hyperlipidemia   . Hypertension    Social History   Socioeconomic History  . Marital status: Divorced    Spouse name: Not on file  . Number of children: 1  . Years of education: Not on file  . Highest education level: Not on file  Occupational History  . Occupation: Retired  Tobacco Use  . Smoking status: Current Every Day Smoker    Types: Cigarettes  . Smokeless tobacco: Never Used  Vaping Use  . Vaping Use: Never used  Substance and Sexual Activity  .  Alcohol use: No  . Drug use: No  . Sexual activity: Not on file  Other Topics Concern  . Not on file  Social History Narrative  . Not on file   Social Determinants of Health   Financial Resource Strain: Not on file  Food Insecurity: Not on file  Transportation Needs: Not on file  Physical Activity: Not on file  Stress: Not on file  Social Connections: Not on file   Family History  Problem Relation Age of Onset  . Anxiety disorder Mother   . Hypertension Mother   . Heart attack Father   . Epilepsy Sister   . Hypertension Sister   . Anxiety disorder Maternal Aunt    Scheduled Meds: . (feeding supplement) PROSource Plus  30 mL Oral QID  . sodium chloride   Intravenous Once  . sodium chloride   Intravenous Once  . sodium chloride   Intravenous Once  . acidophilus  1 capsule Oral Daily  . amLODipine  10 mg Oral Daily  . atorvastatin  20 mg Oral  QHS  . Chlorhexidine Gluconate Cloth  6 each Topical Daily  . cholestyramine  4 g Oral BID  . dronabinol  2.5 mg Oral BID AC  . influenza vaccine adjuvanted  0.5 mL Intramuscular Tomorrow-1000  . mouth rinse  15 mL Mouth Rinse BID  . metoprolol tartrate  50 mg Oral BID  . multivitamin with minerals  1 tablet Oral Daily  . pantoprazole  40 mg Intravenous Q12H  . scopolamine  1 patch Transdermal Q72H  . sertraline  75 mg Oral Daily  . vancomycin  125 mg Oral QID   Continuous Infusions: . sodium chloride 10 mL/hr at 03/05/21 1123  . dextrose 5 % and 0.45 % NaCl with KCl 20 mEq/L 75 mL/hr at 03/06/21 1239  . methocarbamol (ROBAXIN) IV     PRN Meds:.sodium chloride, acetaminophen, alum & mag hydroxide-simeth, bisacodyl, calcium carbonate, labetalol, lip balm, liver oil-zinc oxide, menthol-cetylpyridinium, methocarbamol **OR** methocarbamol (ROBAXIN) IV, ondansetron **OR** ondansetron (ZOFRAN) IV Medications Prior to Admission:  Prior to Admission medications   Medication Sig Start Date End Date Taking? Authorizing Provider   acetaminophen (TYLENOL) 650 MG suppository Place 650 mg rectally every 4 (four) hours as needed for moderate pain or mild pain.   Yes [provider]  amLODipine (NORVASC) 10 MG tablet Take 1 tablet (10 mg total) by mouth daily. 11/06/20  Yes Osvaldo Angst M, PA-C  metoprolol tartrate (LOPRESSOR) 50 MG tablet Take 1 tablet (50 mg total) by mouth 2 (two) times daily. 11/06/20 05/05/21 Yes Pollak, Lavella Hammock, PA-C  pantoprazole (PROTONIX) 40 MG tablet Take one daily as needed for hearburn not relieved by pantoprazole. 11/06/20  Yes Osvaldo Angst M, PA-C  sertraline (ZOLOFT) 50 MG tablet Take 1.5 tablets (75 mg total) by mouth daily. 11/06/20 05/05/21 Yes Trey Sailors, PA-C  simvastatin (ZOCOR) 40 MG tablet Take 1 tablet (40 mg total) by mouth at bedtime. 11/06/20  Yes Trey Sailors, PA-C  TURMERIC PO Take by mouth.   Yes [provider]   Allergies  Allergen Reactions  . Hydrochlorothiazide Diarrhea   Review of Systems  Unable to perform ROS   Physical Exam Pulmonary:     Effort: Pulmonary effort is normal.  Neurological:     Mental Status: She is alert.     Vital Signs: BP 136/61   Pulse 71   Temp (!) 97.5 F (36.4 C) (Oral)   Resp 16   Ht 5\' 5"  (1.651 m)   Wt 49.4 kg   SpO2 90%   BMI 18.14 kg/m  Pain Scale: 0-10 POSS *See Group Information*: S-Acceptable,Sleep, easy to arouse Pain Score: 0-No pain   SpO2: SpO2: 90 % O2 Device:SpO2: 90 % O2 Flow Rate: .O2 Flow Rate (L/min): 2 L/min  IO: Intake/output summary:   Intake/Output Summary (Last 24 hours) at 03/06/2021 1538 Last data filed at 03/06/2021 1400 Gross per 24 hour  Intake 934.52 ml  Output 1360 ml  Net -425.48 ml    LBM: Last BM Date: 03/05/21 Baseline Weight: Weight: 55.3 kg Most recent weight: Weight: 49.4 kg       Time In: 3:30 Time Out: 4:00 Time Total: 30 min Greater than 50%  of this time was spent counseling and coordinating care related to the above assessment and  plan.  Signed by: 03/07/21, NP   Please contact Palliative Medicine Team phone at [phone] for questions and concerns.  For individual provider: See [phone]
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
## 2021-03-07 DIAGNOSIS — S72011A Unspecified intracapsular fracture of right femur, initial encounter for closed fracture: Secondary | ICD-10-CM | POA: Diagnosis not present

## 2021-03-07 DIAGNOSIS — N179 Acute kidney failure, unspecified: Secondary | ICD-10-CM | POA: Diagnosis not present

## 2021-03-07 LAB — PROTEIN ELECTROPHORESIS, SERUM
A/G Ratio: 0.9 (ref 0.7–1.7)
Albumin ELP: 1.7 g/dL — ABNORMAL LOW (ref 2.9–4.4)
Alpha-1-Globulin: 0.3 g/dL (ref 0.0–0.4)
Alpha-2-Globulin: 0.7 g/dL (ref 0.4–1.0)
Beta Globulin: 0.5 g/dL — ABNORMAL LOW (ref 0.7–1.3)
Gamma Globulin: 0.4 g/dL (ref 0.4–1.8)
Globulin, Total: 1.9 g/dL — ABNORMAL LOW (ref 2.2–3.9)
Total Protein ELP: 3.6 g/dL — ABNORMAL LOW (ref 6.0–8.5)

## 2021-03-07 LAB — BASIC METABOLIC PANEL
Anion gap: 6 (ref 5–15)
BUN: 57 mg/dL — ABNORMAL HIGH (ref 8–23)
CO2: 22 mmol/L (ref 22–32)
Calcium: 7 mg/dL — ABNORMAL LOW (ref 8.9–10.3)
Chloride: 117 mmol/L — ABNORMAL HIGH (ref 98–111)
Creatinine, Ser: 3.38 mg/dL — ABNORMAL HIGH (ref 0.44–1.00)
GFR, Estimated: 14 mL/min — ABNORMAL LOW (ref >60)
Glucose, Bld: 96 mg/dL (ref 70–99)
Potassium: 3.5 mmol/L (ref 3.5–5.1)
Sodium: 145 mmol/L (ref 135–145)

## 2021-03-07 LAB — TYPE AND SCREEN
ABO/RH(D): O POS
Antibody Screen: NEGATIVE
Unit division: 0
Unit division: 0
Unit division: 0

## 2021-03-07 LAB — GLUCOSE, CAPILLARY
Glucose-Capillary: 104 mg/dL — ABNORMAL HIGH (ref 70–99)
Glucose-Capillary: 77 mg/dL (ref 70–99)
Glucose-Capillary: 81 mg/dL (ref 70–99)
Glucose-Capillary: 87 mg/dL (ref 70–99)

## 2021-03-07 LAB — BPAM RBC
Blood Product Expiration Date: 202204192359
Blood Product Expiration Date: 202204192359
Blood Product Expiration Date: 202204192359
ISSUE DATE / TIME: 202203141523
ISSUE DATE / TIME: 202203142042
ISSUE DATE / TIME: 202203160832
Unit Type and Rh: 5100
Unit Type and Rh: 5100
Unit Type and Rh: 5100

## 2021-03-07 LAB — CBC
HCT: 26.9 % — ABNORMAL LOW (ref 36.0–46.0)
Hemoglobin: 8.6 g/dL — ABNORMAL LOW (ref 12.0–15.0)
MCH: 30 pg (ref 26.0–34.0)
MCHC: 32 g/dL (ref 30.0–36.0)
MCV: 93.7 fL (ref 80.0–100.0)
Platelets: 131 10*3/uL — ABNORMAL LOW (ref 150–400)
RBC: 2.87 MIL/uL — ABNORMAL LOW (ref 3.87–5.11)
RDW: 17.1 % — ABNORMAL HIGH (ref 11.5–15.5)
WBC: 9.5 10*3/uL (ref 4.0–10.5)
nRBC: 0 % (ref 0.0–0.2)

## 2021-03-07 LAB — PROTEIN / CREATININE RATIO, URINE
Creatinine, Urine: 30 mg/dL
Protein Creatinine Ratio: 6.97 mg/mg{Cre} — ABNORMAL HIGH (ref 0.00–0.15)
Total Protein, Urine: 209 mg/dL

## 2021-03-07 LAB — MAGNESIUM: Magnesium: 1.7 mg/dL (ref 1.7–2.4)

## 2021-03-07 MED ORDER — MAGNESIUM SULFATE 2 GM/50ML IV SOLN
2.0000 g | Freq: Once | INTRAVENOUS | Status: AC
  Administered 2021-03-07: 2 g via INTRAVENOUS
  Filled 2021-03-07: qty 50
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
## 2021-03-07 NOTE — Progress Notes (Signed)
PIV consult: BUE assessed with ultrasound. Pt has limited vascular access options. Bruising and edema noted in both forearms. Please consider central line if prolonged IV medications will be needed.
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
## 2021-03-07 NOTE — Progress Notes (Signed)
   03/07/21 1110  Clinical Encounter Type  Visited With Patient  Visit Type Initial;Spiritual support;Social support  Referral From Nurse  Consult/Referral To Chaplain   Chaplain responded to AD request from nurse, per request PT. Chaplain did AD education with the PT. PT will fill it out at another time and then request chaplain to get it notarized.

## 2021-03-07 NOTE — Progress Notes (Signed)
[facility] [place], [place] 03/07/21  Subjective:   Hospital day # 26  EGD yesterday- large amounts of clots in stomach. Hemostatic Clip placed for ulcer hgb 8.6 today Sitting up in bed trying to eat breakfast Looking better overall    Objective:  Vital signs in last 24 hours:  Temp:  [97.4 F (36.3 C)-98.7 F (37.1 C)] 97.5 F (36.4 C) (03/17 0755) Pulse Rate:  [63-76] 71 (03/17 0920) Resp:  [10-20] 11 (03/17 0900) BP: (125-148)/(59-77) 132/64 (03/17 0920) SpO2:  [90 %-100 %] 98 % (03/17 0900)  Weight change:  Filed Weights   02/09/21 1341 02/10/21 0304 03/05/21 1102  Weight: 55.3 kg 49.4 kg 49.4 kg    Intake/Output:    Intake/Output Summary (Last 24 hours) at 03/07/2021 1059 Last data filed at 03/07/2021 1000 Gross per 24 hour  Intake 1434.82 ml  Output 810 ml  Net 624.82 ml   Physical Exam: General:  No acute distress, laying in the bed  HEENT  anicteric, dry oral mucous membrane  Pulm/lungs  normal breathing effort, lungs are clear to auscultation, Room air  CVS/Heart  regular rhythm, no rub or gallop  Abdomen:   Soft, nontender  Extremities:  No peripheral edema  Neurologic:  Alert, able to follow commands  Skin:  No acute rashes      Basic Metabolic Panel:  Recent Labs  Lab 03/03/21 0436 03/04/21 0645 03/05/21 0459 03/05/21 0726 03/06/21 0417 03/07/21 0709  NA 144 145  --  149* 151* 145  K 4.8 4.8  --  4.3 3.3* 3.5  CL 121* 122*  --  128* 121* 117*  CO2 18* 16*  --  12* 23 22  GLUCOSE 71 100*  --  82 120* 96  BUN 31* 52*  --  65* 63* 57*  CREATININE 2.60* 3.08*  --  3.87* 3.53* 3.38*  CALCIUM 7.9* 8.0*  --  7.4* 7.3* 7.0*  MG 1.9 2.0 1.9  --  1.9 1.7     CBC: Recent Labs  Lab 03/03/21 0436 03/04/21 0645 03/04/21 1326 03/04/21 1504 03/04/21 2026 03/05/21 0032 03/05/21 0154 03/05/21 0459 03/05/21 0726 03/06/21 0417 03/07/21 0709  WBC 14.1* 14.6*  --  15.0*  --   --   --  12.7*  --  11.7* 9.5  NEUTROABS  12.8* 12.5*  --  13.8*  --   --   --  11.4*  --  10.2*  --   HGB 9.0* 7.0*   < > 6.6*   < > 9.7* 9.8* 9.0* 8.5* 6.6* 8.6*  HCT 27.7* 22.8*  --  21.0*  --  29.9*  --  27.5*  --  20.4* 26.9*  MCV 101.5* 102.2*  --  101.4*  --   --   --  97.2  --  98.1 93.7  PLT 244 190  --  161  --   --   --  140*  --  131* 131*   < > = values in this interval not displayed.     No results found for: HEPBSAG, HEPBSAB, HEPBIGM    Microbiology:  Recent Results (from the past 240 hour(s))  C Difficile Quick Screen (NO PCR Reflex)     Status: Abnormal   Collection Time: 02/26/21 11:24 PM   Specimen: STOOL  Result Value Ref Range Status   C Diff antigen POSITIVE (A) NEGATIVE Final   C Diff toxin NEGATIVE NEGATIVE Final   C Diff interpretation   Final    Results  are indeterminate. Please contact the provider listed for your campus for C diff questions in AMION.    Comment: Performed at [facility], [address] [place], [place] [address]  C. Diff by PCR     Status: Abnormal   Collection Time: 02/26/21 11:24 PM   Specimen: STOOL  Result Value Ref Range Status   Toxigenic C. Difficile by PCR POSITIVE (A) NEGATIVE Final    Comment: Positive for toxigenic C. difficile with little to no toxin production. Only treat if clinical presentation suggests symptomatic illness. Performed at [facility], [address] [place], [place] [address]   MRSA PCR Screening     Status: None   Collection Time: 03/04/21  5:00 PM   Specimen: Nasopharyngeal  Result Value Ref Range Status   MRSA by PCR NEGATIVE NEGATIVE Final    Comment:        The GeneXpert MRSA Assay (FDA approved for NASAL specimens only), is one component of a comprehensive MRSA colonization surveillance program. It is not intended to diagnose MRSA infection nor to guide or monitor treatment for MRSA infections. Performed at [facility], [address] [place], [place] [address]     Coagulation  Studies: Recent Labs    03/04/21 04/30/2025 03/05/21 0726  LABPROT 15.1 16.1*  INR 1.2 1.3*    Urinalysis: Recent Labs    03/05/21 1729  COLORURINE YELLOW*  LABSPEC 1.011  PHURINE 6.0  GLUCOSEU NEGATIVE  HGBUR LARGE*  BILIRUBINUR NEGATIVE  KETONESUR NEGATIVE  PROTEINUR 30*  NITRITE NEGATIVE  LEUKOCYTESUR TRACE*      Imaging: US RENAL  Result Date: 03/05/2021 CLINICAL DATA:  Acute kidney injury, history hypertension, hyperlipidemia, smoker EXAM: RENAL / URINARY TRACT ULTRASOUND COMPLETE COMPARISON:  CT abdomen and pelvis 02/09/2021 FINDINGS: Right Kidney: Renal measurements: 10.3 x 4.7 x 5.7 cm = volume: 145 mL. Normal cortical thickness. Increased cortical echogenicity. No mass, hydronephrosis, or shadowing calcification. Left Kidney: Renal measurements: 7.0 x 4.4 x 3.8 cm = volume: 70 mL. Small and atrophic versus RIGHT kidney. Increased cortical echogenicity. No mass, hydronephrosis, or shadowing calcification. Large calculi seen at the LEFT renal pelvis/ureteropelvic junction on prior CT are not adequately visualized sonographically. Bladder: Decompressed by Foley catheter, unable to evaluate. Other: N/A IMPRESSION: Medical renal disease changes of both kidneys without evidence of renal mass or hydronephrosis. Atrophic LEFT kidney. Previously identified LEFT renal calculi at the renal pelvis/ureteropelvic junction are not sonographically demonstrated. Electronically Signed   By: Ulyses Southward M.D.   On: 03/05/2021 19:56     Medications:   . sodium chloride 10 mL/hr at 03/05/21 1123  . dextrose 5 % and 0.45 % NaCl with KCl 20 mEq/L 75 mL/hr at 03/07/21 0800  . magnesium sulfate bolus IVPB    . methocarbamol (ROBAXIN) IV     . (feeding supplement) PROSource Plus  30 mL Oral QID  . sodium chloride   Intravenous Once  . sodium chloride   Intravenous Once  . sodium chloride   Intravenous Once  . acidophilus  1 capsule Oral Daily  . amLODipine  10 mg Oral Daily  . atorvastatin  20  mg Oral QHS  . Chlorhexidine Gluconate Cloth  6 each Topical Daily  . cholestyramine  4 g Oral BID  . dronabinol  2.5 mg Oral BID AC  . influenza vaccine adjuvanted  0.5 mL Intramuscular Tomorrow-1000  . mouth rinse  15 mL Mouth Rinse BID  . metoprolol tartrate  50 mg Oral BID  .  multivitamin with minerals  1 tablet Oral Daily  . pantoprazole  40 mg Intravenous Q12H  . scopolamine  1 patch Transdermal Q72H  . sertraline  75 mg Oral Daily  . vancomycin  125 mg Oral QID   sodium chloride, acetaminophen, alum & mag hydroxide-simeth, bisacodyl, calcium carbonate, labetalol, lip balm, liver oil-zinc oxide, menthol-cetylpyridinium, methocarbamol **OR** methocarbamol (ROBAXIN) IV, ondansetron **OR** ondansetron (ZOFRAN) IV  Assessment/ Plan:  75 y.o. female with     admitted on 02/09/2021 for Vomiting and diarrhea [R11.10, R19.7] Closed fracture of neck of right femur, initial encounter (HCC) [S72.001A] Gastroenteritis due to COVID-19 virus [U07.1, A08.39] COVID-19 [U07.1]   #Covid positive on February 09, 2021 #C. difficile positive 3/8/ 2022  #Acute kidney injury Baseline creatinine of 0.69 from February 19, 2021.    Urinalysis from February 19 shows proteinuria, 21-50 RBCs Imaging: CT abdomen pelvis with contrast: Acute fracture of the right femoral neck, 2 adjacent stones in the region of the left UPJ and 1 cm left UVJ calculus.  No hydronephrosis.  #Hematuria #Proteinuria Hematuria could be related to kidney stones ANA neg; C3 mildly low, C4 normal; k/l ratio is normal Renal U/S 3/15: medical renal disease b/l, Atrophic left kidney, stones not visualized  03/16 0701 - 03/17 0700 In: 1284.8 [I.V.:1284.8] Out: 1060 [Urine:1060]   Lab Results  Component Value Date   CREATININE 3.38 (H) 03/07/2021   CREATININE 3.53 (H) 03/06/2021   CREATININE 3.87 (H) 03/05/2021    s Creatinine has started to improve Foley with urinary sediment UOP satisfactory  AKI likely secondary to  ATN Other differential includes interstitial nephritis  Plan: - continue iv hydration and maintain hemodynamic stability - avoid iv contrast  #GI bleed EGD on March 3 by Dr. Norma Fredrickson shows 3 nonbleeding cratered duodenal ulcer with adherent clots.  Largest lesion 7 mm in diameter.  Moderately severe monilial chronic and erosive esophagitis with no bleeding.  Gastritis EGD 3/15: bleeding site- hemostatic clip placed Blood transfusion on 3/16  # Hypokalemia # hypernatremia  improved with iv iv fluid supplementation     LOS: 26 Kierre Deines 3/17/202210:59 AM  [facility] [address] [place], [place] [phone]  Note: This note was prepared with Dragon dictation. Any transcription errors are unintentional

## 2021-03-07 NOTE — Progress Notes (Signed)
Nutrition Follow-up  DOCUMENTATION CODES:   Severe malnutrition in context of chronic illness  INTERVENTION:   Continue PROSource Plus po QID, each supplement provides 100 kcal and 15 grams of protein.  Add Vital Cuisine BID, each supplement provides 520kcal and 22g of protein.   Continue MVI po daily.  Pt at high refeed risk; recommend monitor potassium, magnesium and phosphorus labs daily until stable  NUTRITION DIAGNOSIS:   Severe Malnutrition related to social / environmental circumstances (suspected inadequate oral intake) as evidenced by severe fat depletion,severe muscle depletion. 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  Pt is s/p right hip hemiarthroplasty on 2/20. Postoperative course has been complicated by upper GI bleed, sepsis, acute AKI and C. difficle  Pt s/p EGD 3/15; found to have duodenal ulcer  Pt continues to have poor appetite and oral intake; pt eating <25% of meals in hospital and is refusing all supplements. Pt is taking some ProSource Plus but is refusing most. Palliative care following for GOC. Pt reports that she would never want a feeding tube. Pt is receiving appetite stimulant. RD will continue ProSource Plus and add Vital Cuisine to meal trays; this is the only supplement that she has not yet tried. There is nothing else that can be offered from a nutritional standpoint as pt is refusing nutrition support. Pt remains at high refeed risk.   Per chart, pt is currently down 13lbs(11%) from her UBW  Medications reviewed and include: Risaquad, Questran, Marinol, Protonix, MVI, Zoloft, Vancomycin,  NaCl w/ 5% dextrose and KCl @75ml/hr, Mg sulfate   Labs reviewed: K 3.5 wnl, BUN 57(H), creat 3.38(H), Mg 1.7 wnl Hgb 8.6(L), Hct 26.9(L)  Diet Order:   Diet Order            Diet regular Room service appropriate? Yes; Fluid consistency: Thin  Diet effective now                 EDUCATION NEEDS:   No education needs have been identified at this time  Skin:  Skin Assessment: Skin Integrity Issues: Skin Integrity Issues:: Stage I,Incisions Stage I: sacrum Incisions: closed incision to right hip  Last BM:  3/16- type 7  Height:   Ht Readings from Last 1 Encounters:  03/05/21 5' 5" (1.651 m)    Weight:   Wt Readings from Last 1 Encounters:  03/05/21 49.4 kg    BMI:  Body mass index is 18.14 kg/m.  Estimated Nutritional Needs:   Kcal:  1500-1700  Protein:  75-85 grams  Fluid:  1.2-1.5 L/day  Casey Campbell MS, RD, LDN Please refer to AMION for RD and/or RD on-call/weekend/after hours pager  

## 2021-03-08 DIAGNOSIS — S72011A Unspecified intracapsular fracture of right femur, initial encounter for closed fracture: Secondary | ICD-10-CM | POA: Diagnosis not present

## 2021-03-08 DIAGNOSIS — N179 Acute kidney failure, unspecified: Secondary | ICD-10-CM | POA: Diagnosis not present

## 2021-03-08 LAB — CBC
HCT: 26 % — ABNORMAL LOW (ref 36.0–46.0)
Hemoglobin: 8.6 g/dL — ABNORMAL LOW (ref 12.0–15.0)
MCH: 30.9 pg (ref 26.0–34.0)
MCHC: 33.1 g/dL (ref 30.0–36.0)
MCV: 93.5 fL (ref 80.0–100.0)
Platelets: 150 10*3/uL (ref 150–400)
RBC: 2.78 MIL/uL — ABNORMAL LOW (ref 3.87–5.11)
RDW: 16.7 % — ABNORMAL HIGH (ref 11.5–15.5)
WBC: 8.3 10*3/uL (ref 4.0–10.5)
nRBC: 0 % (ref 0.0–0.2)

## 2021-03-08 LAB — BASIC METABOLIC PANEL
Anion gap: 4 — ABNORMAL LOW (ref 5–15)
BUN: 53 mg/dL — ABNORMAL HIGH (ref 8–23)
CO2: 21 mmol/L — ABNORMAL LOW (ref 22–32)
Calcium: 7.3 mg/dL — ABNORMAL LOW (ref 8.9–10.3)
Chloride: 116 mmol/L — ABNORMAL HIGH (ref 98–111)
Creatinine, Ser: 3.23 mg/dL — ABNORMAL HIGH (ref 0.44–1.00)
GFR, Estimated: 14 mL/min — ABNORMAL LOW (ref >60)
Glucose, Bld: 91 mg/dL (ref 70–99)
Potassium: 4.1 mmol/L (ref 3.5–5.1)
Sodium: 141 mmol/L (ref 135–145)

## 2021-03-08 LAB — MAGNESIUM: Magnesium: 1.9 mg/dL (ref 1.7–2.4)

## 2021-03-08 LAB — GLUCOSE, CAPILLARY
Glucose-Capillary: 85 mg/dL (ref 70–99)
Glucose-Capillary: 109 mg/dL — ABNORMAL HIGH (ref 70–99)
Glucose-Capillary: 63 mg/dL — ABNORMAL LOW (ref 70–99)

## 2021-03-08 LAB — PHOSPHORUS: Phosphorus: 2.8 mg/dL (ref 2.5–4.6)

## 2021-03-08 NOTE — Plan of Care (Signed)
  Problem: Education: Goal: Verbalization of understanding the information provided (i.e., activity precautions, restrictions, etc) will improve Outcome: Progressing Goal: Individualized Educational Video(s) Outcome: Progressing   Problem: Activity: Goal: Ability to ambulate and perform ADLs will improve Outcome: Progressing   Problem: Clinical Measurements: Goal: Postoperative complications will be avoided or minimized Outcome: Progressing   Problem: Self-Concept: Goal: Ability to maintain and perform role responsibilities to the fullest extent possible will improve Outcome: Progressing   Problem: Pain Management: Goal: Pain level will decrease Outcome: Progressing   Problem: Education: Goal: Knowledge of General Education information will improve Description: Including pain rating scale, medication(s)/side effects and non-pharmacologic comfort measures Outcome: Progressing   Problem: Health Behavior/Discharge Planning: Goal: Ability to manage health-related needs will improve Outcome: Progressing   Problem: Clinical Measurements: Goal: Ability to maintain clinical measurements within normal limits will improve Outcome: Progressing Goal: Will remain free from infection Outcome: Progressing Goal: Diagnostic test results will improve Outcome: Progressing Goal: Respiratory complications will improve Outcome: Progressing Goal: Cardiovascular complication will be avoided Outcome: Progressing   Problem: Activity: Goal: Risk for activity intolerance will decrease Outcome: Progressing   Problem: Nutrition: Goal: Adequate nutrition will be maintained Outcome: Progressing   Problem: Coping: Goal: Level of anxiety will decrease Outcome: Progressing   Problem: Elimination: Goal: Will not experience complications related to bowel motility Outcome: Progressing Goal: Will not experience complications related to urinary retention Outcome: Progressing   Problem: Pain  Managment: Goal: General experience of comfort will improve Outcome: Progressing   Problem: Safety: Goal: Ability to remain free from injury will improve Outcome: Progressing   Problem: Skin Integrity: Goal: Risk for impaired skin integrity will decrease Outcome: Progressing   Problem: Education: Goal: Knowledge of risk factors and measures for prevention of condition will improve Outcome: Progressing   Problem: Coping: Goal: Psychosocial and spiritual needs will be supported Outcome: Progressing   Problem: Respiratory: Goal: Will maintain a patent airway Outcome: Progressing Goal: Complications related to the disease process, condition or treatment will be avoided or minimized Outcome: Progressing   

## 2021-03-08 NOTE — Progress Notes (Signed)
[facility] [place], [place] 03/08/21  Subjective:   Hospital day # 27  EGD  - large amounts of clots in stomach. Hemostatic Clip placed for ulcer Sitting up in bed trying to eat breakfast Looking better overall    Objective:  Vital signs in last 24 hours:  Temp:  [97.3 F (36.3 C)-98.2 F (36.8 C)] 97.7 F (36.5 C) (03/18 0300) Pulse Rate:  [61-75] 66 (03/18 0600) Resp:  [13-20] 15 (03/18 0600) BP: (110-150)/(58-82) 140/59 (03/18 0600) SpO2:  [94 %-100 %] 99 % (03/18 0600)  Weight change:  Filed Weights   02/09/21 1341 02/10/21 0304 03/05/21 1102  Weight: 55.3 kg 49.4 kg 49.4 kg    Intake/Output:    Intake/Output Summary (Last 24 hours) at 03/08/2021 2979 Last data filed at 03/08/2021 0500 Gross per 24 hour  Intake 1421.55 ml  Output 1200 ml  Net 221.55 ml   Physical Exam: General:  No acute distress, laying in the bed  HEENT  anicteric, dry oral mucous membrane  Pulm/lungs  normal breathing effort, clear to auscultation, Room air  CVS/Heart  regular rhythm, no rub or gallop  Abdomen:   Soft, nontender  Extremities:  No peripheral edema  Neurologic:  Alert, able to follow commands  Skin:  No acute rashes      Basic Metabolic Panel:  Recent Labs  Lab 03/04/21 0645 03/05/21 0459 03/05/21 0726 03/06/21 0417 03/07/21 0709 03/08/21 0448  NA 145  --  149* 151* 145 141  K 4.8  --  4.3 3.3* 3.5 4.1  CL 122*  --  128* 121* 117* 116*  CO2 16*  --  12* 23 22 21*  GLUCOSE 100*  --  82 120* 96 91  BUN 52*  --  65* 63* 57* 53*  CREATININE 3.08*  --  3.87* 3.53* 3.38* 3.23*  CALCIUM 8.0*  --  7.4* 7.3* 7.0* 7.3*  MG 2.0 1.9  --  1.9 1.7 1.9  PHOS  --   --   --   --   --  2.8     CBC: Recent Labs  Lab 03/03/21 0436 03/04/21 0645 03/04/21 1326 03/04/21 1504 03/04/21 2026 03/05/21 0032 03/05/21 0154 03/05/21 0459 03/05/21 0726 03/06/21 0417 03/07/21 0709 03/08/21 0448  WBC 14.1* 14.6*  --  15.0*  --   --   --  12.7*  --  11.7*  9.5 8.3  NEUTROABS 12.8* 12.5*  --  13.8*  --   --   --  11.4*  --  10.2*  --   --   HGB 9.0* 7.0*   < > 6.6*   < > 9.7*   < > 9.0* 8.5* 6.6* 8.6* 8.6*  HCT 27.7* 22.8*  --  21.0*  --  29.9*  --  27.5*  --  20.4* 26.9* 26.0*  MCV 101.5* 102.2*  --  101.4*  --   --   --  97.2  --  98.1 93.7 93.5  PLT 244 190  --  161  --   --   --  140*  --  131* 131* 150   < > = values in this interval not displayed.     No results found for: HEPBSAG, HEPBSAB, HEPBIGM    Microbiology:  Recent Results (from the past 240 hour(s))  C Difficile Quick Screen (NO PCR Reflex)     Status: Abnormal   Collection Time: 02/26/21 11:24 PM   Specimen: STOOL  Result Value Ref Range Status  C Diff antigen POSITIVE (A) NEGATIVE Final   C Diff toxin NEGATIVE NEGATIVE Final   C Diff interpretation   Final    Results are indeterminate. Please contact the provider listed for your campus for C diff questions in AMION.    Comment: Performed at [facility], [address] [place], [place] [address]  C. Diff by PCR     Status: Abnormal   Collection Time: 02/26/21 11:24 PM   Specimen: STOOL  Result Value Ref Range Status   Toxigenic C. Difficile by PCR POSITIVE (A) NEGATIVE Final    Comment: Positive for toxigenic C. difficile with little to no toxin production. Only treat if clinical presentation suggests symptomatic illness. Performed at [facility], [address] [place], [place] [address]   MRSA PCR Screening     Status: None   Collection Time: 03/04/21  5:00 PM   Specimen: Nasopharyngeal  Result Value Ref Range Status   MRSA by PCR NEGATIVE NEGATIVE Final    Comment:        The GeneXpert MRSA Assay (FDA approved for NASAL specimens only), is one component of a comprehensive MRSA colonization surveillance program. It is not intended to diagnose MRSA infection nor to guide or monitor treatment for MRSA infections. Performed at [facility], [address]  [place], [place] [address]     Coagulation Studies: No results for input(s): LABPROT, INR in the last 72 hours.  Urinalysis: Recent Labs    03/05/21 1729  COLORURINE YELLOW*  LABSPEC 1.011  PHURINE 6.0  GLUCOSEU NEGATIVE  HGBUR LARGE*  BILIRUBINUR NEGATIVE  KETONESUR NEGATIVE  PROTEINUR 30*  NITRITE NEGATIVE  LEUKOCYTESUR TRACE*      Imaging: No results found.   Medications:   . sodium chloride 10 mL/hr at 03/05/21 1123  . dextrose 5 % and 0.45 % NaCl with KCl 20 mEq/L 75 mL/hr at 03/08/21 0606  . methocarbamol (ROBAXIN) IV     . (feeding supplement) PROSource Plus  30 mL Oral QID  . sodium chloride   Intravenous Once  . sodium chloride   Intravenous Once  . sodium chloride   Intravenous Once  . acidophilus  1 capsule Oral Daily  . amLODipine  10 mg Oral Daily  . atorvastatin  20 mg Oral QHS  . Chlorhexidine Gluconate Cloth  6 each Topical Daily  . dronabinol  2.5 mg Oral BID AC  . influenza vaccine adjuvanted  0.5 mL Intramuscular Tomorrow-1000  . mouth rinse  15 mL Mouth Rinse BID  . metoprolol tartrate  50 mg Oral BID  . multivitamin with minerals  1 tablet Oral Daily  . pantoprazole  40 mg Intravenous Q12H  . scopolamine  1 patch Transdermal Q72H  . sertraline  75 mg Oral Daily  . vancomycin  125 mg Oral QID   sodium chloride, acetaminophen, alum & mag hydroxide-simeth, bisacodyl, calcium carbonate, labetalol, lip balm, liver oil-zinc oxide, menthol-cetylpyridinium, methocarbamol **OR** methocarbamol (ROBAXIN) IV, ondansetron **OR** ondansetron (ZOFRAN) IV  Assessment/ Plan:  75 y.o. female with     admitted on 02/09/2021 for Vomiting and diarrhea [R11.10, R19.7] Closed fracture of neck of right femur, initial encounter (HCC) [S72.001A] Gastroenteritis due to COVID-19 virus [U07.1, A08.39] COVID-19 [U07.1]   #Covid positive on February 09, 2021 #C. difficile positive 3/8/ 2022  #Acute kidney injury Baseline creatinine of 0.69 from February 19, 2021.     Urinalysis from February 19 shows proteinuria, 21-50 RBCs Imaging: CT abdomen pelvis with contrast: Acute fracture of  the right femoral neck, 2 adjacent stones in the region of the left UPJ and 1 cm left UVJ calculus.  No hydronephrosis.  03/17 0701 - 03/18 0700 In: 1571.6 [P.O.:220; I.V.:1317.8; IV Piggyback:33.9] Out: 1200 [Urine:1200]   Lab Results  Component Value Date   CREATININE 3.23 (H) 03/08/2021   CREATININE 3.38 (H) 03/07/2021   CREATININE 3.53 (H) 03/06/2021      AKI likely secondary to ATN Other differential includes interstitial nephritis  #Hematuria #Proteinuria Hematuria could be related to kidney stones ANA neg; C3 mildly low, C4 normal; k/l ratio is normal Renal U/S 3/15: medical renal disease b/l, Atrophic left kidney, stones not visualized Urine P/C ratio 7 gm 03/07/2021  Plan: s Creatinine is slowly improving Foley with urinary sediment UOP satisfactory - continue iv hydration and maintain hemodynamic stability - avoid iv contrast  #GI bleed EGD on March 3 by Dr. Norma Fredrickson shows 3 nonbleeding cratered duodenal ulcer with adherent clots.  Largest lesion 7 mm in diameter.  Moderately severe monilial chronic and erosive esophagitis with no bleeding.  Gastritis EGD 3/15: bleeding site- hemostatic clip placed Blood transfusion on 3/16  # Hypokalemia # hypernatremia  improved with iv iv fluid supplementation Now has normal oral intake; May d/c supplemental iv fluids later in the day    LOS: 27 Melanie White 3/18/20229:22 AM  [facility] [place], [place] [phone]  Note: This note was prepared with Dragon dictation. Any transcription errors are unintentional

## 2021-03-08 NOTE — Progress Notes (Signed)
Uneventful day. Remained in SR all day. Refuses to eat or drink even when encouraged. Right hip dressing clean dry and intact.  Remains confused as to time and date all day. States she has no living family. Has denied pain all day. No family or friends listed in demographics. Unable to coordinate hands to ring call bell or turn TV on or off.
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
## 2021-03-09 DIAGNOSIS — N179 Acute kidney failure, unspecified: Secondary | ICD-10-CM | POA: Diagnosis not present

## 2021-03-09 DIAGNOSIS — S72011A Unspecified intracapsular fracture of right femur, initial encounter for closed fracture: Secondary | ICD-10-CM | POA: Diagnosis not present

## 2021-03-09 LAB — BASIC METABOLIC PANEL
Anion gap: 5 (ref 5–15)
BUN: 47 mg/dL — ABNORMAL HIGH (ref 8–23)
CO2: 22 mmol/L (ref 22–32)
Calcium: 7.7 mg/dL — ABNORMAL LOW (ref 8.9–10.3)
Chloride: 113 mmol/L — ABNORMAL HIGH (ref 98–111)
Creatinine, Ser: 3.16 mg/dL — ABNORMAL HIGH (ref 0.44–1.00)
GFR, Estimated: 15 mL/min — ABNORMAL LOW (ref >60)
Glucose, Bld: 99 mg/dL (ref 70–99)
Potassium: 5.5 mmol/L — ABNORMAL HIGH (ref 3.5–5.1)
Sodium: 140 mmol/L (ref 135–145)

## 2021-03-09 LAB — GLUCOSE, CAPILLARY
Glucose-Capillary: 55 mg/dL — ABNORMAL LOW (ref 70–99)
Glucose-Capillary: 63 mg/dL — ABNORMAL LOW (ref 70–99)
Glucose-Capillary: 68 mg/dL — ABNORMAL LOW (ref 70–99)
Glucose-Capillary: 72 mg/dL (ref 70–99)
Glucose-Capillary: 76 mg/dL (ref 70–99)
Glucose-Capillary: 84 mg/dL (ref 70–99)
Glucose-Capillary: 85 mg/dL (ref 70–99)
Glucose-Capillary: 85 mg/dL (ref 70–99)
Glucose-Capillary: 92 mg/dL (ref 70–99)

## 2021-03-09 LAB — CBC
HCT: 29 % — ABNORMAL LOW (ref 36.0–46.0)
Hemoglobin: 9.3 g/dL — ABNORMAL LOW (ref 12.0–15.0)
MCH: 30.4 pg (ref 26.0–34.0)
MCHC: 32.1 g/dL (ref 30.0–36.0)
MCV: 94.8 fL (ref 80.0–100.0)
Platelets: 193 10*3/uL (ref 150–400)
RBC: 3.06 MIL/uL — ABNORMAL LOW (ref 3.87–5.11)
RDW: 16.8 % — ABNORMAL HIGH (ref 11.5–15.5)
WBC: 7.9 10*3/uL (ref 4.0–10.5)
nRBC: 0 % (ref 0.0–0.2)

## 2021-03-09 LAB — MAGNESIUM: Magnesium: 1.9 mg/dL (ref 1.7–2.4)

## 2021-03-09 MED ORDER — SODIUM ZIRCONIUM CYCLOSILICATE 5 G PO PACK
5.0000 g | PACK | Freq: Two times a day (BID) | ORAL | Status: DC
  Administered 2021-03-09 – 2021-03-11 (×5): 5 g via ORAL
  Filled 2021-03-09 (×6): qty 1

## 2021-03-09 MED ORDER — CALCIUM POLYCARBOPHIL 625 MG PO TABS
625.0000 mg | ORAL_TABLET | Freq: Two times a day (BID) | ORAL | Status: DC
  Administered 2021-03-09 – 2021-03-11 (×5): 625 mg via ORAL
  Filled 2021-03-09 (×7): qty 1

## 2021-03-09 MED ORDER — CHLORHEXIDINE GLUCONATE CLOTH 2 % EX PADS
6.0000 | MEDICATED_PAD | Freq: Every day | CUTANEOUS | Status: DC
  Administered 2021-03-09 – 2021-03-11 (×3): 6 via TOPICAL

## 2021-03-09 NOTE — Progress Notes (Signed)
Pt Blood sugar 55. Orange juice given. Blood sugar 63 after 15 mins, 76 at 30 mins. Will cont to monitor pt.

## 2021-03-09 NOTE — Progress Notes (Signed)
OT Cancellation Note  Patient Details Name: Melanie White MRN: [id number] DOB: [birth year]   Cancelled Treatment:    Reason Eval/Treat Not Completed: Patient declined, no reason specified;Other (comment)  Pt declines OT re-evaluation at this time. Does not cite a reason, but with multiple modes of participation offered, pt still politely declines. Will f/u at later date/time for re-evaluation as able. Thank you.  Rejeana Brock, MS, OTR/L ascom [phone] 03/09/21, 5:50 PM
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
## 2021-03-10 DIAGNOSIS — S72011A Unspecified intracapsular fracture of right femur, initial encounter for closed fracture: Secondary | ICD-10-CM | POA: Diagnosis not present

## 2021-03-10 DIAGNOSIS — N179 Acute kidney failure, unspecified: Secondary | ICD-10-CM | POA: Diagnosis not present

## 2021-03-10 LAB — GLUCOSE, CAPILLARY
Glucose-Capillary: 121 mg/dL — ABNORMAL HIGH (ref 70–99)
Glucose-Capillary: 62 mg/dL — ABNORMAL LOW (ref 70–99)
Glucose-Capillary: 63 mg/dL — ABNORMAL LOW (ref 70–99)
Glucose-Capillary: 81 mg/dL (ref 70–99)
Glucose-Capillary: 89 mg/dL (ref 70–99)

## 2021-03-10 LAB — BASIC METABOLIC PANEL
Anion gap: 7 (ref 5–15)
BUN: 46 mg/dL — ABNORMAL HIGH (ref 8–23)
CO2: 21 mmol/L — ABNORMAL LOW (ref 22–32)
Calcium: 7.4 mg/dL — ABNORMAL LOW (ref 8.9–10.3)
Chloride: 111 mmol/L (ref 98–111)
Creatinine, Ser: 3.01 mg/dL — ABNORMAL HIGH (ref 0.44–1.00)
GFR, Estimated: 16 mL/min — ABNORMAL LOW (ref >60)
Glucose, Bld: 71 mg/dL (ref 70–99)
Potassium: 4.5 mmol/L (ref 3.5–5.1)
Sodium: 139 mmol/L (ref 135–145)

## 2021-03-10 LAB — CBC
HCT: 25.8 % — ABNORMAL LOW (ref 36.0–46.0)
Hemoglobin: 8.2 g/dL — ABNORMAL LOW (ref 12.0–15.0)
MCH: 30 pg (ref 26.0–34.0)
MCHC: 31.8 g/dL (ref 30.0–36.0)
MCV: 94.5 fL (ref 80.0–100.0)
Platelets: 191 10*3/uL (ref 150–400)
RBC: 2.73 MIL/uL — ABNORMAL LOW (ref 3.87–5.11)
RDW: 16.7 % — ABNORMAL HIGH (ref 11.5–15.5)
WBC: 8.4 10*3/uL (ref 4.0–10.5)
nRBC: 0 % (ref 0.0–0.2)

## 2021-03-10 LAB — MAGNESIUM: Magnesium: 1.8 mg/dL (ref 1.7–2.4)

## 2021-03-10 NOTE — Plan of Care (Signed)
  Problem: Activity: Goal: Ability to ambulate and perform ADLs will improve Outcome: Not Progressing   
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
## 2021-03-10 NOTE — TOC Progression Note (Addendum)
Transition of Care [facility]) - Progression Note    Patient Details  Name: DACODA SPALLONE MRN: [id number] Date of Birth: 11/11/1946  Transition [facility]) CM/SW Contact  Liliana Cline, LCSW Phone Number: 03/10/2021, 4:19 PM  Clinical Narrative:   This afternoon an individual came to visit patient and told LPN that he lives with patient and was asking questions about her medical status, patient said it was ok but is disoriented at times per chart/LPN. Patient has been here 29 days and staff has had no contact with this individual until today. Patient previously told staff she had no family/friends/contacts that could help her. There is one note from the ED saying she had a roommate with COVID. Patient told this CSW on 2/25 that she lives alone. CSW reached out to [facility] for guidance/confirmation on if information can be shared with this individual- Jodelle Green.     Expected Discharge Plan: Skilled Nursing Facility Barriers to Discharge: Continued Medical Work up  Expected Discharge Plan and Services Expected Discharge Plan: Skilled Nursing Facility       Living arrangements for the past 2 months: Single Family Home                                       Social Determinants of Health (SDOH) Interventions    Readmission Risk Interventions No flowsheet data found.

## 2021-03-10 NOTE — Progress Notes (Signed)
Progress Note: Pt started asking this nurse "did you see my friend? Where did she go?, she was just here. She also has a baby. Did you see a baby?" This nurse confirmed with pt, there are no other people in the room. Will monitor.

## 2021-03-10 NOTE — Progress Notes (Addendum)
Progress note:  This nurse spoke with pt. Pt recalled her visitor. Per pt, visitor by the name  of Jodelle Green, a friend, she has known him for 43 years. Pt stated "we live together in [address], [address] Pt also gave verbal consent to share her information with Mr. Christell Constant.

## 2021-03-10 NOTE — TOC Progression Note (Addendum)
Transition of Care [facility]) - Progression Note    Patient Details  Name: Melanie White MRN: [id number] Date of Birth: 02-May-1946  Transition of Care [facility]) CM/SW Contact  Liliana Cline, LCSW Phone Number: 03/10/2021, 9:36 AM  Clinical Narrative:   Spoke to MD. Plan is still for SNF when medically stable. CSW sent updated referral/notes to local SNFs since patient is no longer under COVID isolation. Insurance authorization will need to be started when patient is closer to being medically stable for discharge.   2:40- Patient cleared by nephrology for SNF. 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 Will need to update Navi with different SNF if SNF changes.   Expected Discharge Plan: Skilled Nursing Facility Barriers to Discharge: Continued Medical Work up  Expected Discharge Plan and Services Expected Discharge Plan: Skilled Nursing Facility       Living arrangements for the past 2 months: Single Family Home                                       Social Determinants of Health (SDOH) Interventions    Readmission Risk Interventions No flowsheet data found.

## 2021-03-10 NOTE — Progress Notes (Addendum)
Progress Note:  House-mate and Friend goes by Mr. Dannielle Burn visited pt, today. Mr. Christell Constant expressed that he and the pt live together.  Both of her daughters are deceased. Per Mr. Christell Constant, pt has no other relatives. Mr. Christell Constant inquiring about pt's mental orientation, update on pt's status, types of infection, injuries, and medical information. Mr. Christell Constant stated " I need to know because I need to know what bills to pay and such. This nurse cannot disclose information due to HIPPA. Mr. Christell Constant not pleased. This nurse requested Mr. Christell Constant contact number; [phone].

## 2021-03-11 DIAGNOSIS — M6281 Muscle weakness (generalized): Secondary | ICD-10-CM | POA: Diagnosis not present

## 2021-03-11 DIAGNOSIS — F339 Major depressive disorder, recurrent, unspecified: Secondary | ICD-10-CM | POA: Diagnosis not present

## 2021-03-11 DIAGNOSIS — K208 Other esophagitis without bleeding: Secondary | ICD-10-CM | POA: Diagnosis not present

## 2021-03-11 DIAGNOSIS — R531 Weakness: Secondary | ICD-10-CM | POA: Diagnosis not present

## 2021-03-11 DIAGNOSIS — R0902 Hypoxemia: Secondary | ICD-10-CM | POA: Diagnosis not present

## 2021-03-11 DIAGNOSIS — Z8616 Personal history of COVID-19: Secondary | ICD-10-CM | POA: Diagnosis not present

## 2021-03-11 DIAGNOSIS — E46 Unspecified protein-calorie malnutrition: Secondary | ICD-10-CM | POA: Diagnosis not present

## 2021-03-11 DIAGNOSIS — J9601 Acute respiratory failure with hypoxia: Secondary | ICD-10-CM | POA: Diagnosis not present

## 2021-03-11 DIAGNOSIS — E785 Hyperlipidemia, unspecified: Secondary | ICD-10-CM | POA: Diagnosis not present

## 2021-03-11 DIAGNOSIS — I959 Hypotension, unspecified: Secondary | ICD-10-CM | POA: Diagnosis not present

## 2021-03-11 DIAGNOSIS — R278 Other lack of coordination: Secondary | ICD-10-CM | POA: Diagnosis not present

## 2021-03-11 DIAGNOSIS — S72011A Unspecified intracapsular fracture of right femur, initial encounter for closed fracture: Secondary | ICD-10-CM | POA: Diagnosis not present

## 2021-03-11 DIAGNOSIS — E43 Unspecified severe protein-calorie malnutrition: Secondary | ICD-10-CM | POA: Diagnosis not present

## 2021-03-11 DIAGNOSIS — I10 Essential (primary) hypertension: Secondary | ICD-10-CM | POA: Diagnosis not present

## 2021-03-11 DIAGNOSIS — N179 Acute kidney failure, unspecified: Secondary | ICD-10-CM | POA: Diagnosis not present

## 2021-03-11 DIAGNOSIS — N132 Hydronephrosis with renal and ureteral calculous obstruction: Secondary | ICD-10-CM | POA: Diagnosis not present

## 2021-03-11 DIAGNOSIS — S72011D Unspecified intracapsular fracture of right femur, subsequent encounter for closed fracture with routine healing: Secondary | ICD-10-CM | POA: Diagnosis not present

## 2021-03-11 DIAGNOSIS — Z724 Inappropriate diet and eating habits: Secondary | ICD-10-CM | POA: Diagnosis not present

## 2021-03-11 DIAGNOSIS — K219 Gastro-esophageal reflux disease without esophagitis: Secondary | ICD-10-CM | POA: Diagnosis not present

## 2021-03-11 DIAGNOSIS — R109 Unspecified abdominal pain: Secondary | ICD-10-CM | POA: Diagnosis not present

## 2021-03-11 DIAGNOSIS — Z743 Need for continuous supervision: Secondary | ICD-10-CM | POA: Diagnosis not present

## 2021-03-11 LAB — CBC
HCT: 28.1 % — ABNORMAL LOW (ref 36.0–46.0)
Hemoglobin: 9.4 g/dL — ABNORMAL LOW (ref 12.0–15.0)
MCH: 31.4 pg (ref 26.0–34.0)
MCHC: 33.5 g/dL (ref 30.0–36.0)
MCV: 94 fL (ref 80.0–100.0)
Platelets: 239 10*3/uL (ref 150–400)
RBC: 2.99 MIL/uL — ABNORMAL LOW (ref 3.87–5.11)
RDW: 16.7 % — ABNORMAL HIGH (ref 11.5–15.5)
WBC: 8.6 10*3/uL (ref 4.0–10.5)
nRBC: 0 % (ref 0.0–0.2)

## 2021-03-11 LAB — BASIC METABOLIC PANEL
Anion gap: 6 (ref 5–15)
BUN: 42 mg/dL — ABNORMAL HIGH (ref 8–23)
CO2: 21 mmol/L — ABNORMAL LOW (ref 22–32)
Calcium: 7.4 mg/dL — ABNORMAL LOW (ref 8.9–10.3)
Chloride: 110 mmol/L (ref 98–111)
Creatinine, Ser: 2.89 mg/dL — ABNORMAL HIGH (ref 0.44–1.00)
GFR, Estimated: 16 mL/min — ABNORMAL LOW (ref >60)
Glucose, Bld: 87 mg/dL (ref 70–99)
Potassium: 4 mmol/L (ref 3.5–5.1)
Sodium: 137 mmol/L (ref 135–145)

## 2021-03-11 LAB — RESP PANEL BY RT-PCR (FLU A&B, COVID) ARPGX2
Influenza A by PCR: NEGATIVE
Influenza B by PCR: NEGATIVE
SARS Coronavirus 2 by RT PCR: NEGATIVE

## 2021-03-11 LAB — GLUCOSE, CAPILLARY
Glucose-Capillary: 127 mg/dL — ABNORMAL HIGH (ref 70–99)
Glucose-Capillary: 75 mg/dL (ref 70–99)
Glucose-Capillary: 91 mg/dL (ref 70–99)

## 2021-03-11 LAB — MAGNESIUM: Magnesium: 1.9 mg/dL (ref 1.7–2.4)

## 2021-03-11 MED ORDER — CALCIUM POLYCARBOPHIL 625 MG PO TABS: 625.0000 mg | ORAL_TABLET | Freq: Two times a day (BID) | ORAL | Status: AC

## 2021-03-11 MED ORDER — ATORVASTATIN CALCIUM 20 MG PO TABS
20.0000 mg | ORAL_TABLET | Freq: Every day | ORAL | Status: AC
Start: 2021-03-11 — End: ?

## 2021-03-11 MED ORDER — BLISTEX MEDICATED EX OINT: 1.0000 "application " | TOPICAL_OINTMENT | CUTANEOUS | Status: AC | PRN

## 2021-03-11 MED ORDER — PROSOURCE PLUS PO LIQD: 30.0000 mL | Freq: Four times a day (QID) | ORAL | Status: AC

## 2021-03-11 MED ORDER — RISAQUAD PO CAPS: 1.0000 | ORAL_CAPSULE | Freq: Every day | ORAL | Status: AC

## 2021-03-11 MED ORDER — PANTOPRAZOLE SODIUM 40 MG PO TBEC: 40.0000 mg | DELAYED_RELEASE_TABLET | Freq: Two times a day (BID) | ORAL | Status: AC

## 2021-03-11 MED ORDER — ADULT MULTIVITAMIN W/MINERALS CH: 1.0000 | ORAL_TABLET | Freq: Every day | ORAL | Status: AC

## 2021-03-11 MED ORDER — DRONABINOL 5 MG PO CAPS: 5.0000 mg | ORAL_CAPSULE | Freq: Two times a day (BID) | ORAL | Status: AC

## 2021-03-11 MED ORDER — ZINC OXIDE 40 % EX OINT: TOPICAL_OINTMENT | Freq: Three times a day (TID) | CUTANEOUS | 0 refills | Status: AC | PRN

## 2021-03-11 NOTE — TOC Progression Note (Signed)
Transition of Care [facility]) - Progression Note    Patient Details  Name: Melanie White MRN: [id number] Date of Birth: 1946/01/15  Transition of Care [facility]) CM/SW Contact  Margarito Liner, LCSW Phone Number: 03/11/2021, 12:08 PM  Clinical Narrative:   [facility] approved. Left message for [facility] admissions coordinator to see if they would have a bed today.  Expected Discharge Plan: Skilled Nursing Facility Barriers to Discharge: Continued Medical Work up  Expected Discharge Plan and Services Expected Discharge Plan: Skilled Nursing Facility       Living arrangements for the past 2 months: Single Family Home                                       Social Determinants of Health (SDOH) Interventions    Readmission Risk Interventions No flowsheet data found.

## 2021-03-11 NOTE — Care Management Important Message (Signed)
Important Message  Patient Details  Name: Melanie White MRN: [id number] Date of Birth: Oct 03, 1946   Medicare Important Message Given:  Other (see comment)  Patient is in an isolation room and I called twice but no answer.  Called the nursing station and they said they doing patient care.  Will try again before I leave for the day.   Olegario Messier A Loza Prell 03/11/2021, 3:10 PM
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
## 2021-03-11 NOTE — Care Management Important Message (Signed)
Important Message  Patient Details  Name: Melanie White MRN: [id number] Date of Birth: Feb 06, 1946   Medicare Important Message Given:  Other (see comment)  Patient is in an isolation room and I tried calling the room again but no answer.   Olegario Messier A Najae Rathert 03/11/2021, 3:31 PM

## 2021-03-11 NOTE — TOC Transition Note (Signed)
Transition of Care [facility]) - CM/SW Discharge Note   Patient Details  Name: Melanie White MRN: [id number] Date of Birth: 10-27-1946  Transition of Care [facility]) CM/SW Contact:  Margarito Liner, LCSW Phone Number: 03/11/2021, 3:41 PM   Clinical Narrative:  Patient has orders to discharge to [facility] today. RN will call report to [phone] (Room 15B). Transport has been arranged and she is 4th on the list. No further concerns. CSW signing off.   Final next level of care: Skilled Nursing Facility Barriers to Discharge: Barriers Resolved   Patient Goals and CMS Choice Patient states their goals for this hospitalization and ongoing recovery are:: SNF rehab CMS Medicare.gov Compare Post Acute Care list provided to:: Patient Choice offered to / list presented to : Patient  Discharge Placement PASRR number recieved: 02/18/21            Patient [facility] bed at: [facility] Patient to be transferred to facility by: EMS Name of family member notified: Patient declined Patient and family notified of of transfer: 03/11/21  Discharge Plan and Services                                     Social Determinants of Health (SDOH) Interventions     Readmission Risk Interventions No flowsheet data found.
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
## 2021-03-11 NOTE — Plan of Care (Signed)
Discussed with patient plan of care for the evening, pain management and bed alarm with some teach back displayed.  Patient encouraged to finish more of her dinner meal with assistance.

## 2021-03-11 NOTE — TOC Progression Note (Signed)
Transition of Care [facility]) - Progression Note    Patient Details  Name: Melanie White MRN: [id number] Date of Birth: October 25, 1946  [facility] CM/SW Contact  Margarito Liner, LCSW Phone Number: 03/11/2021, 2:05 PM  Clinical Narrative:  Notified patient of plan to discharge to [facility] today. COVID test pending. CSW asked if she wanted me to call anyone but patient declined.   Expected Discharge Plan: Skilled Nursing Facility Barriers to Discharge: Continued Medical Work up  Expected Discharge Plan and Services Expected Discharge Plan: Skilled Nursing Facility       Living arrangements for the past 2 months: Single Family Home Expected Discharge Date: 03/11/21                                     Social Determinants of Health (SDOH) Interventions    Readmission Risk Interventions No flowsheet data found.

## 2021-03-11 NOTE — Progress Notes (Signed)
Discharge Note: Reported to [place] health care Nurse, Shanda Bumps, RN. Reviewed discharge instructions via telephone.

## 2021-03-12 ENCOUNTER — Telehealth: Payer: Self-pay

## 2021-03-12 NOTE — Telephone Encounter (Signed)
Should f/u with [place] after discharged from [facility]

## 2021-03-12 NOTE — Telephone Encounter (Signed)
  Patient was recently discharged from the hospital on 03/12/21.  No TCM completed, patient does not qualify for TCM services due to being d/c to a SNF.  Per discharge summary patient needs follow up with PCP. No HFU scheduled at this time.

## 2021-03-27 DIAGNOSIS — L89626 Pressure-induced deep tissue damage of left heel: Secondary | ICD-10-CM | POA: Diagnosis not present

## 2021-03-27 DIAGNOSIS — L97828 Non-pressure chronic ulcer of other part of left lower leg with other specified severity: Secondary | ICD-10-CM | POA: Diagnosis not present

## 2021-03-27 DIAGNOSIS — M6281 Muscle weakness (generalized): Secondary | ICD-10-CM | POA: Diagnosis not present

## 2021-03-27 DIAGNOSIS — L89152 Pressure ulcer of sacral region, stage 2: Secondary | ICD-10-CM | POA: Diagnosis not present

## 2021-03-28 NOTE — Telephone Encounter (Signed)
Opened in error

## 2021-04-02 DIAGNOSIS — Z96641 Presence of right artificial hip joint: Secondary | ICD-10-CM | POA: Diagnosis not present

## 2021-04-02 DIAGNOSIS — S72031A Displaced midcervical fracture of right femur, initial encounter for closed fracture: Secondary | ICD-10-CM | POA: Diagnosis not present

## 2021-04-03 DIAGNOSIS — L97828 Non-pressure chronic ulcer of other part of left lower leg with other specified severity: Secondary | ICD-10-CM | POA: Diagnosis not present

## 2021-04-03 DIAGNOSIS — L89152 Pressure ulcer of sacral region, stage 2: Secondary | ICD-10-CM | POA: Diagnosis not present

## 2021-04-03 DIAGNOSIS — L89626 Pressure-induced deep tissue damage of left heel: Secondary | ICD-10-CM | POA: Diagnosis not present

## 2021-04-03 DIAGNOSIS — M6281 Muscle weakness (generalized): Secondary | ICD-10-CM | POA: Diagnosis not present

## 2021-04-04 DIAGNOSIS — F331 Major depressive disorder, recurrent, moderate: Secondary | ICD-10-CM | POA: Diagnosis not present

## 2021-04-10 DIAGNOSIS — L97828 Non-pressure chronic ulcer of other part of left lower leg with other specified severity: Secondary | ICD-10-CM | POA: Diagnosis not present

## 2021-04-10 DIAGNOSIS — L89152 Pressure ulcer of sacral region, stage 2: Secondary | ICD-10-CM | POA: Diagnosis not present

## 2021-04-10 DIAGNOSIS — L89626 Pressure-induced deep tissue damage of left heel: Secondary | ICD-10-CM | POA: Diagnosis not present

## 2021-04-10 DIAGNOSIS — M6281 Muscle weakness (generalized): Secondary | ICD-10-CM | POA: Diagnosis not present

## 2021-04-16 DIAGNOSIS — I10 Essential (primary) hypertension: Secondary | ICD-10-CM | POA: Diagnosis not present

## 2021-04-16 DIAGNOSIS — D519 Vitamin B12 deficiency anemia, unspecified: Secondary | ICD-10-CM | POA: Diagnosis not present

## 2021-04-16 DIAGNOSIS — D649 Anemia, unspecified: Secondary | ICD-10-CM | POA: Diagnosis not present

## 2021-04-16 DIAGNOSIS — E119 Type 2 diabetes mellitus without complications: Secondary | ICD-10-CM | POA: Diagnosis not present

## 2021-04-17 DIAGNOSIS — E119 Type 2 diabetes mellitus without complications: Secondary | ICD-10-CM | POA: Diagnosis not present

## 2021-04-17 DIAGNOSIS — D519 Vitamin B12 deficiency anemia, unspecified: Secondary | ICD-10-CM | POA: Diagnosis not present

## 2021-04-18 DIAGNOSIS — L89626 Pressure-induced deep tissue damage of left heel: Secondary | ICD-10-CM | POA: Diagnosis not present

## 2021-04-18 DIAGNOSIS — L89152 Pressure ulcer of sacral region, stage 2: Secondary | ICD-10-CM | POA: Diagnosis not present

## 2021-04-18 DIAGNOSIS — L97828 Non-pressure chronic ulcer of other part of left lower leg with other specified severity: Secondary | ICD-10-CM | POA: Diagnosis not present

## 2021-04-18 DIAGNOSIS — L97929 Non-pressure chronic ulcer of unspecified part of left lower leg with unspecified severity: Secondary | ICD-10-CM | POA: Diagnosis not present

## 2021-04-18 DIAGNOSIS — E46 Unspecified protein-calorie malnutrition: Secondary | ICD-10-CM | POA: Diagnosis not present

## 2021-04-23 DIAGNOSIS — E46 Unspecified protein-calorie malnutrition: Secondary | ICD-10-CM | POA: Diagnosis not present

## 2021-04-26 DIAGNOSIS — E46 Unspecified protein-calorie malnutrition: Secondary | ICD-10-CM | POA: Diagnosis not present

## 2021-04-26 DIAGNOSIS — D519 Vitamin B12 deficiency anemia, unspecified: Secondary | ICD-10-CM | POA: Diagnosis not present

## 2021-04-26 DIAGNOSIS — E119 Type 2 diabetes mellitus without complications: Secondary | ICD-10-CM | POA: Diagnosis not present

## 2021-04-26 DIAGNOSIS — D649 Anemia, unspecified: Secondary | ICD-10-CM | POA: Diagnosis not present

## 2021-04-29 ENCOUNTER — Other Ambulatory Visit: Payer: Self-pay

## 2021-04-29 ENCOUNTER — Non-Acute Institutional Stay: Payer: Medicare Other | Admitting: Nurse Practitioner

## 2021-04-29 ENCOUNTER — Encounter: Payer: Self-pay | Admitting: Nurse Practitioner

## 2021-04-29 VITALS — BP 105/60 | HR 72 | Resp 18 | Wt 105.0 lb

## 2021-04-29 DIAGNOSIS — Z515 Encounter for palliative care: Secondary | ICD-10-CM

## 2021-04-29 DIAGNOSIS — R5381 Other malaise: Secondary | ICD-10-CM

## 2021-04-29 DIAGNOSIS — E43 Unspecified severe protein-calorie malnutrition: Secondary | ICD-10-CM
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
## 2021-05-01 DIAGNOSIS — D649 Anemia, unspecified: Secondary | ICD-10-CM | POA: Diagnosis not present

## 2021-05-01 DIAGNOSIS — E119 Type 2 diabetes mellitus without complications: Secondary | ICD-10-CM | POA: Diagnosis not present

## 2021-05-01 DIAGNOSIS — D519 Vitamin B12 deficiency anemia, unspecified: Secondary | ICD-10-CM | POA: Diagnosis not present

## 2021-05-01 DIAGNOSIS — E46 Unspecified protein-calorie malnutrition: Secondary | ICD-10-CM | POA: Diagnosis not present

## 2021-05-22 DEATH — deceased

## 2021-09-20 IMAGING — DX DG ABDOMEN 1V
1 series · 1 of 1 positions shown · non-contrast
Comparison: February 09, 2021

CLINICAL DATA: Abdominal pain

EXAM:
ABDOMEN - 1 VIEW

[abdomen supine]
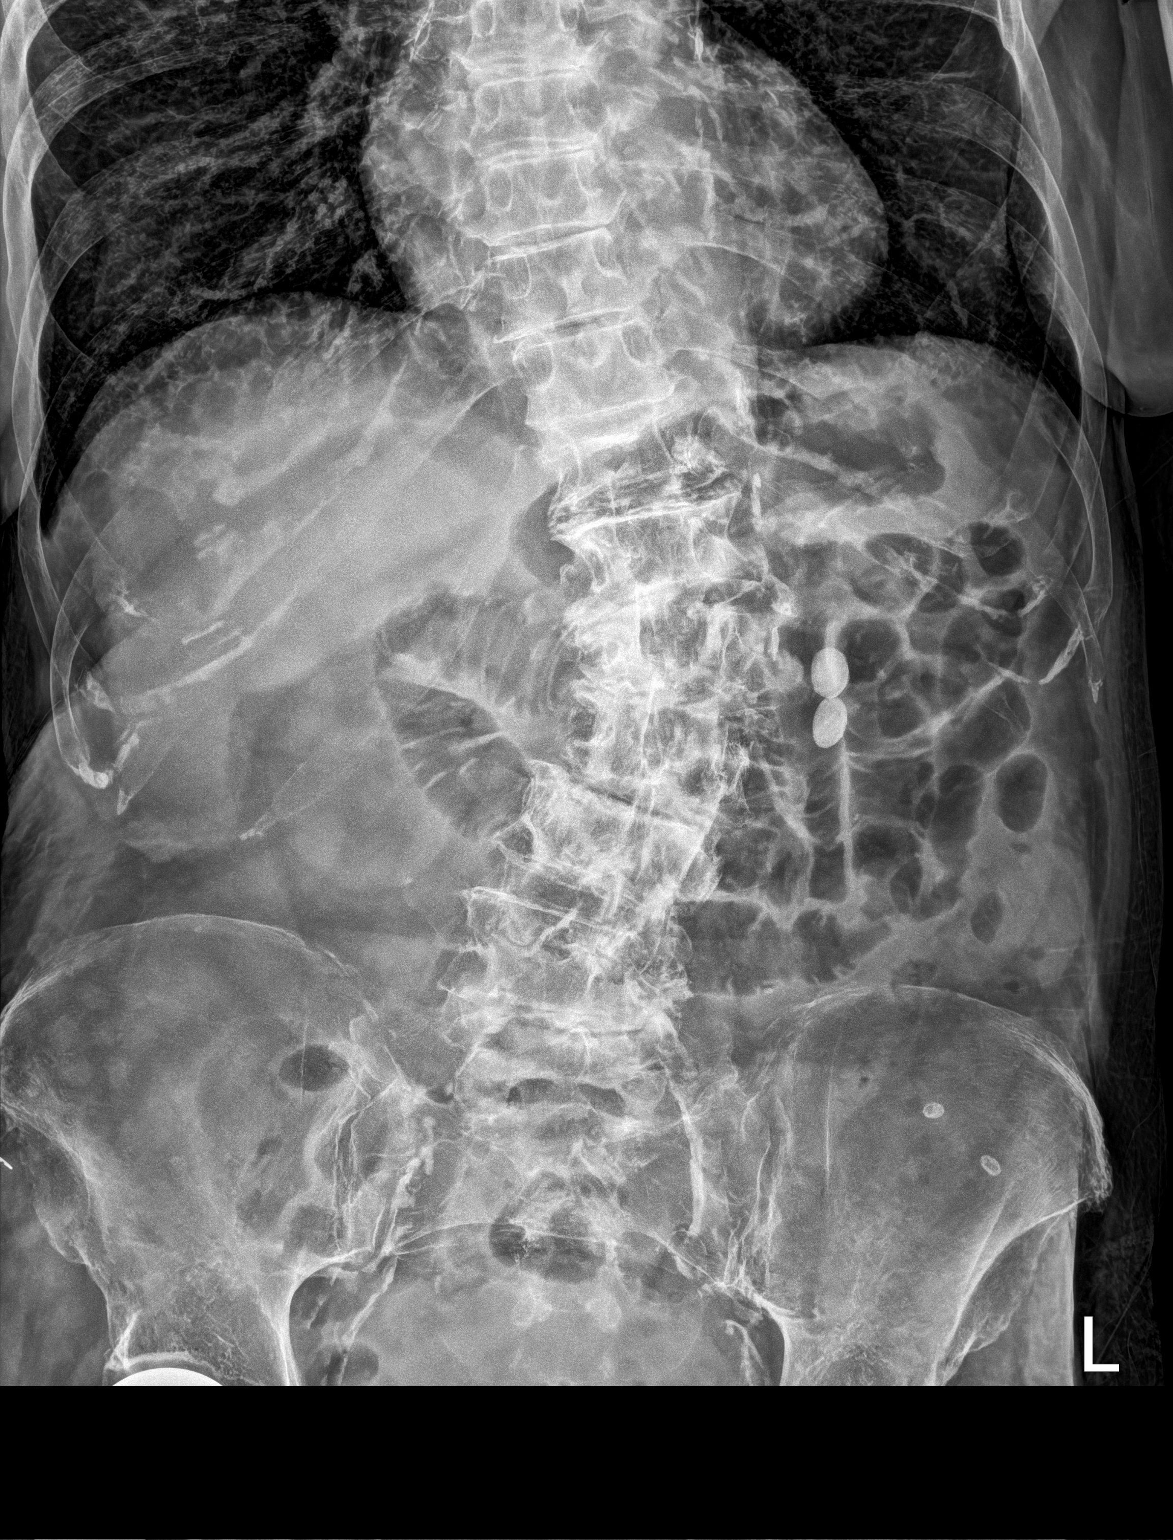

[1 of 1 positions shown; findings below may reference images not displayed]

FINDINGS: There is no appreciable bowel dilatation or air-fluid level to
suggest bowel obstruction. No evident free air. Lung bases clear.

Calcifications in the left mid abdomen consistent with known
proximal ureteral calculi seen on recent CT. Calcification left
pelvis consistent with known ureterovesical junction calculus seen
on recent CT in this area.

Total hip replacement noted on the right.

There is aortic and iliac artery atherosclerosis.
IMPRESSION: 1.  No bowel obstruction or free air.

2. Persistent proximal and distal left ureteral calculi, not
appreciably changed compared to prior findings on CT.

3.  Total hip replacement on the right.

4.  Aortic Atherosclerosis ([id number]).
